# Patient Record
Sex: Male | Born: 1950 | Race: White | Hispanic: No | Marital: Married | Smoking: Former smoker
Health system: Southern US, Community
[De-identification: ages and names within clinical notes are randomized; demographics above are authoritative.]

## PROBLEM LIST (undated history)

## (undated) DIAGNOSIS — I10 Essential (primary) hypertension: Secondary | ICD-10-CM

## (undated) DIAGNOSIS — Z87442 Personal history of urinary calculi: Secondary | ICD-10-CM

## (undated) DIAGNOSIS — K219 Gastro-esophageal reflux disease without esophagitis: Secondary | ICD-10-CM

## (undated) DIAGNOSIS — H9190 Unspecified hearing loss, unspecified ear: Secondary | ICD-10-CM

## (undated) DIAGNOSIS — R112 Nausea with vomiting, unspecified: Secondary | ICD-10-CM

## (undated) DIAGNOSIS — Z9889 Other specified postprocedural states: Secondary | ICD-10-CM

## (undated) DIAGNOSIS — Z889 Allergy status to unspecified drugs, medicaments and biological substances status: Secondary | ICD-10-CM

## (undated) DIAGNOSIS — M5416 Radiculopathy, lumbar region: Secondary | ICD-10-CM

## (undated) HISTORY — PX: BACK SURGERY: SHX140

---

## 1998-08-03 ENCOUNTER — Ambulatory Visit (HOSPITAL_COMMUNITY): Admission: RE | Admit: 1998-08-03 | Discharge: 1998-08-03 | Payer: Self-pay | Admitting: Gastroenterology

## 2000-09-12 ENCOUNTER — Emergency Department (HOSPITAL_COMMUNITY): Admission: EM | Admit: 2000-09-12 | Discharge: 2000-09-13 | Payer: Self-pay | Admitting: Emergency Medicine

## 2000-09-12 ENCOUNTER — Encounter: Payer: Self-pay | Admitting: Emergency Medicine

## 2001-05-15 ENCOUNTER — Encounter: Payer: Self-pay | Admitting: Orthopedic Surgery

## 2001-05-15 ENCOUNTER — Ambulatory Visit (HOSPITAL_COMMUNITY): Admission: RE | Admit: 2001-05-15 | Discharge: 2001-05-15 | Payer: Self-pay | Admitting: Orthopedic Surgery

## 2004-10-12 ENCOUNTER — Ambulatory Visit: Payer: Self-pay | Admitting: Internal Medicine

## 2004-10-19 ENCOUNTER — Ambulatory Visit: Payer: Self-pay | Admitting: Internal Medicine

## 2005-02-11 ENCOUNTER — Ambulatory Visit: Payer: Self-pay | Admitting: Internal Medicine

## 2005-04-30 ENCOUNTER — Encounter: Admission: RE | Admit: 2005-04-30 | Discharge: 2005-04-30 | Payer: Self-pay | Admitting: Specialist

## 2006-05-16 ENCOUNTER — Ambulatory Visit: Payer: Self-pay | Admitting: Internal Medicine

## 2006-05-16 LAB — CONVERTED CEMR LAB
Albumin: 4.2 g/dL (ref 3.5–5.2)
Basophils Absolute: 0 10*3/uL (ref 0.0–0.1)
Basophils Relative: 0.4 % (ref 0.0–1.0)
Bilirubin, Direct: 0.1 mg/dL (ref 0.0–0.3)
Calcium: 9.3 mg/dL (ref 8.4–10.5)
GFR calc Af Amer: 81 mL/min
GFR calc non Af Amer: 67 mL/min
Glucose, Bld: 115 mg/dL — ABNORMAL HIGH (ref 70–99)
HCT: 45.7 % (ref 39.0–52.0)
HDL: 44.5 mg/dL (ref >39.0)
Hemoglobin: 15.6 g/dL (ref 13.0–17.0)
Hgb A1c MFr Bld: 5.7 % (ref 4.6–6.0)
LDL Cholesterol: 110 mg/dL — ABNORMAL HIGH (ref 0–99)
Monocytes Absolute: 0.6 10*3/uL (ref 0.2–0.7)
Monocytes Relative: 7.6 % (ref 3.0–11.0)
PSA: 0.69 ng/mL (ref 0.10–4.00)
RDW: 12.2 % (ref 11.5–14.6)
Sodium: 142 meq/L (ref 135–145)
TSH: 1.45 microintl units/mL (ref 0.35–5.50)
VLDL: 22 mg/dL (ref 0–40)

## 2006-05-23 ENCOUNTER — Ambulatory Visit: Payer: Self-pay | Admitting: Internal Medicine

## 2008-01-05 ENCOUNTER — Telehealth: Payer: Self-pay | Admitting: Internal Medicine

## 2008-01-18 ENCOUNTER — Ambulatory Visit: Payer: Self-pay | Admitting: Internal Medicine

## 2008-01-18 LAB — CONVERTED CEMR LAB
ALT: 25 units/L (ref 0–53)
Basophils Absolute: 0 10*3/uL (ref 0.0–0.1)
Basophils Relative: 0.4 % (ref 0.0–3.0)
CO2: 29 meq/L (ref 19–32)
Calcium: 8.9 mg/dL (ref 8.4–10.5)
Cholesterol: 179 mg/dL (ref 0–200)
Creatinine, Ser: 1.1 mg/dL (ref 0.4–1.5)
Eosinophils Absolute: 0.1 10*3/uL (ref 0.0–0.7)
GFR calc Af Amer: 89 mL/min
HCT: 45 % (ref 39.0–52.0)
Hemoglobin: 15.6 g/dL (ref 13.0–17.0)
Lymphocytes Relative: 36.8 % (ref 12.0–46.0)
MCHC: 34.7 g/dL (ref 30.0–36.0)
MCV: 96.8 fL (ref 78.0–100.0)
Monocytes Absolute: 0.6 10*3/uL (ref 0.1–1.0)
Neutro Abs: 3.2 10*3/uL (ref 1.4–7.7)
PSA: 0.68 ng/mL (ref 0.10–4.00)
RBC: 4.65 M/uL (ref 4.22–5.81)
TSH: 1.17 microintl units/mL (ref 0.35–5.50)
Total Bilirubin: 0.8 mg/dL (ref 0.3–1.2)
Triglycerides: 84 mg/dL (ref 0–149)

## 2008-01-27 ENCOUNTER — Ambulatory Visit: Payer: Self-pay | Admitting: Internal Medicine

## 2008-01-27 DIAGNOSIS — J309 Allergic rhinitis, unspecified: Secondary | ICD-10-CM | POA: Insufficient documentation

## 2008-01-27 DIAGNOSIS — E785 Hyperlipidemia, unspecified: Secondary | ICD-10-CM

## 2008-09-22 ENCOUNTER — Encounter (INDEPENDENT_AMBULATORY_CARE_PROVIDER_SITE_OTHER): Payer: Self-pay | Admitting: Specialist

## 2008-09-22 ENCOUNTER — Ambulatory Visit (HOSPITAL_COMMUNITY): Admission: RE | Admit: 2008-09-22 | Discharge: 2008-09-23 | Payer: Self-pay | Admitting: Specialist

## 2009-02-06 ENCOUNTER — Encounter (INDEPENDENT_AMBULATORY_CARE_PROVIDER_SITE_OTHER): Payer: Self-pay | Admitting: *Deleted

## 2009-03-03 ENCOUNTER — Encounter (INDEPENDENT_AMBULATORY_CARE_PROVIDER_SITE_OTHER): Payer: Self-pay | Admitting: *Deleted

## 2009-03-06 ENCOUNTER — Ambulatory Visit: Payer: Self-pay | Admitting: Gastroenterology

## 2009-03-06 ENCOUNTER — Telehealth (INDEPENDENT_AMBULATORY_CARE_PROVIDER_SITE_OTHER): Payer: Self-pay | Admitting: *Deleted

## 2009-03-22 ENCOUNTER — Ambulatory Visit: Payer: Self-pay | Admitting: Gastroenterology

## 2010-07-02 LAB — CBC
MCHC: 34.8 g/dL (ref 30.0–36.0)
RBC: 4.74 MIL/uL (ref 4.22–5.81)
RDW: 12.9 % (ref 11.5–15.5)

## 2010-07-02 LAB — COMPREHENSIVE METABOLIC PANEL
ALT: 25 U/L (ref 0–53)
AST: 21 U/L (ref 0–37)
Albumin: 4.1 g/dL (ref 3.5–5.2)
Calcium: 9.3 mg/dL (ref 8.4–10.5)
GFR calc Af Amer: >60 mL/min (ref >60)
Sodium: 143 mEq/L (ref 135–145)
Total Protein: 6.1 g/dL (ref 6.0–8.3)

## 2010-07-02 LAB — URINALYSIS, ROUTINE W REFLEX MICROSCOPIC
Bilirubin Urine: NEGATIVE
Nitrite: NEGATIVE
Specific Gravity, Urine: 1.03 (ref 1.005–1.030)
Urobilinogen, UA: 0.2 mg/dL (ref 0.0–1.0)

## 2010-08-07 NOTE — Op Note (Signed)
NAMECAID, RADIN             ACCOUNT NO.:  [id number]   MEDICAL RECORD NO.:  [id number]          PATIENT TYPE:  AMB   LOCATION:  DAY                          FACILITY:  [facility]   PHYSICIAN:  Jene Every, M.D.    DATE OF BIRTH:  Mar 16, 1951   DATE OF PROCEDURE:  09/22/2008  DATE OF DISCHARGE:                               OPERATIVE REPORT   PREOPERATIVE DIAGNOSES:  Spinal stenosis, herniated nucleus pulposus, L3-  4, left.   POSTOPERATIVE DIAGNOSES:  Spinal stenosis, herniated nucleus pulposus,  L3-4, left.   PROCEDURE PERFORMED:  1. Decompression, L3-4, left.  2. Microdiskectomy at L3-4, left.  3. Retrieval of free fragment.  4. Use of operating microscope.   BRIEF HISTORY:  The is a 60 year old with left lower extremity  radiculopathy, L4 nerve root distribution, quad weakness, diminished  reflex, decreased sensation in L4 dermatome, positive neural tension  sign and MRI that indicated an extruded fragment compressing the 4 root.  He was indicated for decompression of the 4 root by microdiskectomy.  The risks and benefits were discussed including bleeding, infection,  damage to neurovascular structures, CSF leakage, epidural fibrosis,  adjacent segment disease, need for fusion in the future, anesthetic  complications, etc.  He was also instructed to the commit to tobacco  cessation perioperatively to decrease the risk of epidural fibrosis,  infection and disk degenerating.   TECHNIQUE:  With the patient in the supine position after adequate  general anesthesia and 2 grams of Kefzol, the patient was placed prone  on the [place] frame.  All bony prominences were well-padded.  The  lumbar region was prepped and draped in the usual sterile fashion.  Two  18 gauge spinal needles were utilized to localize the 3-4 interspace and  confirmed with x-ray.  Incision was made from spinous process of 3-4.  The subcutaneous tissue was dissected with electrocautery to achieve  hemostasis.  Dorsolumbar fascia identified and divided in line with the  skin incision.  The paraspinous muscle elevated from lamina the lamina  of 3 and 4.  McCollough retractors placed.  Penfield 4 in the  interlaminar space, confirmed by x-ray.  Operating microscope draped and  brought on the surgical field.  We felt that with the extruded fragment  migrating caudad and it would be just under the interlaminar window of 3-  4 and therefore, we would not threaten the pars with a unilateral  decompression.  Ligamentum flavum detached from the cephalad edge of 4  utilizing a straight curette.  Then had placed a Penfield 4 beneath the  lamina and placed a neural patty beneath the lamina and then performed a  foraminotomy of 4 and decompressed it.  .  We gently mobilized the 4  root medially and saw a focal HNP extending out into the foramen and  cephalad to the disk space.  We decompressed the lateral recess and the  medial border of the pedicle protecting neural elements at all times.  The fragments extended up to the disk space and down to and into the  foramen of 4.  I started cephalad and then gently  mobilized a fragment  from beneath the thecal sac.  It was adherent to the thecal sac, as was  a fragment adherent to the 4 root.  This was meticulously mobilized with  a combination of a Penfield 4, a nerve hook and micro pituitary.  Gently  freeing the adhesed fragment from the nerve root and the thecal sac.  The disk herniation actually was retrieved in three separate fragments,  one beneath the thecal sac and again the other adherent to the 4 root  and out into the foramen of 4.  Following retrieval of these fragments  we evaluated cephalad to the disk space.  There was no residual disk  herniation.  There was osteophytic ridge.  I placed a hockey stick probe  out the foramen of 3 and 4 and they are widely patent.  We had at least  a centimeter excursion of the 4 root and medial pedicle  without  difficulty.  The nerve root was erythematous and edematous, but intact.  There was a vascular release.  Electrocautery to achieve hemostasis as  was thrombin-soaked Gelfoam.  We checked beneath the thecal sac, out the  foramen of 4 and 3, the shoulder of the root, the axilla of the root and  there was no residual disk herniation amenable to further disk  herniation and a freely mobile 4 root.  After copious irrigation with  antibiotic irrigation, thrombin-soaked Gelfoam we inspected and no CSF  leakage or active bleeding.  Therefore, we removed the [facility]  retractors.  Paraspinous muscle inspected with no active bleeding.  They  were  irrigated and closed and then closed the fascia with zero Vicryl  interrupted figure-of-eight sutures, subcutaneous with 2-0 Vicryl and  the  skin was reapproximated with 4-0 subcuticular Prolene.  Wound  reinforced with Steri-Strips.  Sterile dressing applied.  The patient  was placed supine on the hospital bed, extubated without difficulty and  transported to the recovery room in satisfactory addition.   The patient tolerated the procedure.  There were no complications.  Minimal blood loss.      Jene Every, M.D.  Electronically Signed     JB/MEDQ  D:  09/22/2008  T:  09/22/2008  Job:  [id number]

## 2010-10-15 ENCOUNTER — Other Ambulatory Visit: Payer: Self-pay | Admitting: Neurosurgery

## 2010-10-15 DIAGNOSIS — M545 Low back pain: Secondary | ICD-10-CM

## 2010-10-18 ENCOUNTER — Ambulatory Visit
Admission: RE | Admit: 2010-10-18 | Discharge: 2010-10-18 | Disposition: A | Discharge status: Home / Self Care | Payer: BC Managed Care – PPO | Source: Ambulatory Visit | Attending: Neurosurgery | Admitting: Neurosurgery

## 2010-10-18 DIAGNOSIS — M545 Low back pain: Secondary | ICD-10-CM

## 2010-10-18 MED ORDER — GADOBENATE DIMEGLUMINE 529 MG/ML IV SOLN
20.0000 mL | Freq: Once | INTRAVENOUS | Status: AC | PRN
  Administered 2010-10-18: 20 mL via INTRAVENOUS

## 2014-10-25 ENCOUNTER — Encounter: Payer: Self-pay | Admitting: Gastroenterology

## 2015-10-13 ENCOUNTER — Other Ambulatory Visit: Payer: Self-pay | Admitting: Neurosurgery

## 2015-10-13 DIAGNOSIS — M47817 Spondylosis without myelopathy or radiculopathy, lumbosacral region: Secondary | ICD-10-CM

## 2015-10-19 ENCOUNTER — Ambulatory Visit
Admission: RE | Admit: 2015-10-19 | Discharge: 2015-10-19 | Disposition: A | Discharge status: Home / Self Care | Payer: PRIVATE HEALTH INSURANCE | Source: Ambulatory Visit | Attending: Neurosurgery | Admitting: Neurosurgery

## 2015-10-19 DIAGNOSIS — M47817 Spondylosis without myelopathy or radiculopathy, lumbosacral region: Secondary | ICD-10-CM

## 2018-02-13 ENCOUNTER — Other Ambulatory Visit: Payer: Self-pay | Admitting: Neurosurgery

## 2018-02-13 DIAGNOSIS — M4716 Other spondylosis with myelopathy, lumbar region: Secondary | ICD-10-CM

## 2018-02-13 DIAGNOSIS — M5126 Other intervertebral disc displacement, lumbar region: Secondary | ICD-10-CM

## 2018-02-14 ENCOUNTER — Ambulatory Visit
Admission: RE | Admit: 2018-02-14 | Discharge: 2018-02-14 | Disposition: A | Discharge status: Home / Self Care | Payer: Medicare HMO | Source: Ambulatory Visit | Attending: Neurosurgery | Admitting: Neurosurgery

## 2018-02-14 DIAGNOSIS — M5126 Other intervertebral disc displacement, lumbar region: Secondary | ICD-10-CM

## 2018-02-14 DIAGNOSIS — M4716 Other spondylosis with myelopathy, lumbar region: Secondary | ICD-10-CM

## 2018-02-16 ENCOUNTER — Other Ambulatory Visit: Payer: Self-pay | Admitting: Nurse Practitioner

## 2018-02-16 DIAGNOSIS — M4716 Other spondylosis with myelopathy, lumbar region: Secondary | ICD-10-CM

## 2018-02-18 ENCOUNTER — Ambulatory Visit
Admission: RE | Admit: 2018-02-18 | Discharge: 2018-02-18 | Disposition: A | Discharge status: Home / Self Care | Payer: Medicare HMO | Source: Ambulatory Visit | Attending: Nurse Practitioner | Admitting: Nurse Practitioner

## 2018-02-18 DIAGNOSIS — M4716 Other spondylosis with myelopathy, lumbar region: Secondary | ICD-10-CM

## 2018-02-18 MED ORDER — IOPAMIDOL (ISOVUE-M 200) INJECTION 41%
1.0000 mL | Freq: Once | INTRAMUSCULAR | Status: AC
  Administered 2018-02-18: 1 mL via EPIDURAL

## 2018-02-18 MED ORDER — METHYLPREDNISOLONE ACETATE 40 MG/ML INJ SUSP (RADIOLOG
120.0000 mg | Freq: Once | INTRAMUSCULAR | Status: AC
  Administered 2018-02-18: 120 mg via EPIDURAL

## 2018-02-26 ENCOUNTER — Other Ambulatory Visit: Payer: PRIVATE HEALTH INSURANCE

## 2018-02-26 ENCOUNTER — Other Ambulatory Visit: Payer: Self-pay | Admitting: Neurosurgery

## 2018-02-26 DIAGNOSIS — M4716 Other spondylosis with myelopathy, lumbar region: Secondary | ICD-10-CM

## 2018-03-05 ENCOUNTER — Other Ambulatory Visit: Payer: Self-pay | Admitting: Neurosurgery

## 2018-03-05 ENCOUNTER — Ambulatory Visit
Admission: RE | Admit: 2018-03-05 | Discharge: 2018-03-05 | Disposition: A | Discharge status: Home / Self Care | Payer: Medicare HMO | Source: Ambulatory Visit | Attending: Neurosurgery | Admitting: Neurosurgery

## 2018-03-05 DIAGNOSIS — M4716 Other spondylosis with myelopathy, lumbar region: Secondary | ICD-10-CM

## 2018-03-05 MED ORDER — IOPAMIDOL (ISOVUE-M 200) INJECTION 41%
1.0000 mL | Freq: Once | INTRAMUSCULAR | Status: AC
  Administered 2018-03-05: 1 mL via EPIDURAL

## 2018-03-05 MED ORDER — METHYLPREDNISOLONE ACETATE 40 MG/ML INJ SUSP (RADIOLOG
120.0000 mg | Freq: Once | INTRAMUSCULAR | Status: AC
  Administered 2018-03-05: 120 mg via EPIDURAL

## 2018-04-08 ENCOUNTER — Other Ambulatory Visit: Payer: Self-pay | Admitting: Neurosurgery

## 2018-04-08 DIAGNOSIS — M5126 Other intervertebral disc displacement, lumbar region: Secondary | ICD-10-CM

## 2018-04-13 ENCOUNTER — Ambulatory Visit
Admission: RE | Admit: 2018-04-13 | Discharge: 2018-04-13 | Disposition: A | Discharge status: Home / Self Care | Payer: Medicare HMO | Source: Ambulatory Visit | Attending: Neurosurgery | Admitting: Neurosurgery

## 2018-04-13 DIAGNOSIS — M5126 Other intervertebral disc displacement, lumbar region: Secondary | ICD-10-CM

## 2018-04-13 MED ORDER — IOPAMIDOL (ISOVUE-M 200) INJECTION 41%
1.0000 mL | Freq: Once | INTRAMUSCULAR | Status: AC
  Administered 2018-04-13: 1 mL via EPIDURAL

## 2018-04-13 MED ORDER — METHYLPREDNISOLONE ACETATE 40 MG/ML INJ SUSP (RADIOLOG
120.0000 mg | Freq: Once | INTRAMUSCULAR | Status: AC
  Administered 2018-04-13: 120 mg via EPIDURAL

## 2018-06-02 ENCOUNTER — Other Ambulatory Visit: Payer: Self-pay | Admitting: Neurological Surgery

## 2018-06-18 NOTE — Pre-Procedure Instructions (Signed)
Anders Simmonds Jr.  06/18/2018      [id number] - [place], [place] - [address] [place] [address] Phone: [phone] Fax: [phone]    Your procedure is scheduled on Monday, June 29, 2018.  Report to [facility] Admitting - Entrance A at 10:45 am.  Call this number if you have problems the morning of surgery:  [phone]   Remember:  Do not eat or drink after midnight.    Take these medicines the morning of surgery with A SIP OF WATER:  None  7 days prior to surgery STOP taking any Aspirin (unless otherwise instructed by your surgeon), Aleve, Naproxen, Ibuprofen, Motrin, Advil, Goody's, BC's, all herbal medications, fish oil, and all vitamins.    Do not wear jewelry.  Do not wear lotions, powders, or cologne/aftershave, or deodorant.  Men may shave face and neck.  Do not bring valuables to the hospital.  [facility] is not responsible for any belongings or valuables.  Contacts, dentures or bridgework may not be worn into surgery.  Leave your suitcase in the car.  After surgery it may be brought to your room.  For patients admitted to the hospital, discharge time will be determined by your treatment team.  Patients discharged the day of surgery will not be allowed to drive home.    [place]- Preparing For Surgery  Before surgery, you can play an important role. Because skin is not sterile, your skin needs to be as free of germs as possible. You can reduce the number of germs on your skin by washing with CHG (chlorahexidine gluconate) Soap before surgery.  CHG is an antiseptic cleaner which kills germs and bonds with the skin to continue killing germs even after washing.    Oral Hygiene is also important to reduce your risk of infection.  Remember - BRUSH YOUR TEETH THE MORNING OF SURGERY WITH YOUR REGULAR TOOTHPASTE  Please do not use if you have an allergy to CHG or antibacterial  soaps. If your skin becomes reddened/irritated stop using the CHG.  Do not shave (including legs and underarms) for at least 48 hours prior to first CHG shower. It is OK to shave your face.  Please follow these instructions carefully.   1. Shower the NIGHT BEFORE SURGERY and the MORNING OF SURGERY with CHG.   2. If you chose to wash your hair, wash your hair first as usual with your normal shampoo.  3. After you shampoo, rinse your hair and body thoroughly to remove the shampoo.  4. Use CHG as you would any other liquid soap. You can apply CHG directly to the skin and wash gently with a scrungie or a clean washcloth.   5. Apply the CHG Soap to your body ONLY FROM THE NECK DOWN.  Do not use on open wounds or open sores. Avoid contact with your eyes, ears, mouth and genitals (private parts). Wash Face and genitals (private parts)  with your normal soap.  6. Wash thoroughly, paying special attention to the area where your surgery will be performed.  7. Thoroughly rinse your body with warm water from the neck down.  8. DO NOT shower/wash with your normal soap after using and rinsing off the CHG Soap.  9. Pat yourself dry with a CLEAN TOWEL.  10. Wear CLEAN PAJAMAS to bed the night before surgery, wear comfortable clothes the morning of surgery  11. Place CLEAN  SHEETS on your bed the night of your first shower and DO NOT SLEEP WITH PETS.   Day of Surgery: Do not apply any deodorants/lotions.  Please wear clean clothes to the hospital/surgery center.     Patient was informed of Hospital's Visitation Restriction Policy that is now in effect.

## 2018-06-19 ENCOUNTER — Encounter (HOSPITAL_COMMUNITY): Payer: Self-pay

## 2018-06-19 ENCOUNTER — Encounter (HOSPITAL_COMMUNITY)
Admission: RE | Admit: 2018-06-19 | Discharge: 2018-06-19 | Disposition: A | Discharge status: Home / Self Care | Payer: Medicare HMO | Source: Ambulatory Visit | Attending: Neurological Surgery | Admitting: Neurological Surgery

## 2018-06-19 ENCOUNTER — Other Ambulatory Visit: Payer: Self-pay

## 2018-06-19 DIAGNOSIS — Z01818 Encounter for other preprocedural examination: Secondary | ICD-10-CM | POA: Diagnosis present

## 2018-06-19 HISTORY — DX: Other specified postprocedural states: Z98.890

## 2018-06-19 HISTORY — DX: Personal history of urinary calculi: Z87.442

## 2018-06-19 HISTORY — DX: Unspecified hearing loss, unspecified ear: H91.90

## 2018-06-19 HISTORY — DX: Allergy status to unspecified drugs, medicaments and biological substances: Z88.9

## 2018-06-19 HISTORY — DX: Nausea with vomiting, unspecified: R11.2

## 2018-06-19 HISTORY — DX: Gastro-esophageal reflux disease without esophagitis: K21.9

## 2018-06-19 HISTORY — DX: Essential (primary) hypertension: I10

## 2018-06-19 LAB — BASIC METABOLIC PANEL
Anion gap: 8 (ref 5–15)
BUN: 16 mg/dL (ref 8–23)
CO2: 24 mmol/L (ref 22–32)
Calcium: 9.3 mg/dL (ref 8.9–10.3)
Chloride: 105 mmol/L (ref 98–111)
Creatinine, Ser: 1 mg/dL (ref 0.61–1.24)
GFR calc Af Amer: >60 mL/min (ref >60)
GFR calc non Af Amer: >60 mL/min (ref >60)
Glucose, Bld: 136 mg/dL — ABNORMAL HIGH (ref 70–99)
Potassium: 4.6 mmol/L (ref 3.5–5.1)
Sodium: 137 mmol/L (ref 135–145)

## 2018-06-19 LAB — CBC
HCT: 43.8 % (ref 39.0–52.0)
Hemoglobin: 14.5 g/dL (ref 13.0–17.0)
MCH: 31.4 pg (ref 26.0–34.0)
MCHC: 33.1 g/dL (ref 30.0–36.0)
MCV: 94.8 fL (ref 80.0–100.0)
PLATELETS: 237 10*3/uL (ref 150–400)
RBC: 4.62 MIL/uL (ref 4.22–5.81)
RDW: 12 % (ref 11.5–15.5)
WBC: 5.2 10*3/uL (ref 4.0–10.5)
nRBC: 0 % (ref 0.0–0.2)

## 2018-06-19 LAB — TYPE AND SCREEN
ABO/RH(D): A POS
Antibody Screen: NEGATIVE

## 2018-06-19 LAB — ABO/RH: ABO/RH(D): A POS

## 2018-06-19 LAB — SURGICAL PCR SCREEN
MRSA, PCR: NEGATIVE
Staphylococcus aureus: POSITIVE — AB

## 2018-06-19 NOTE — Pre-Procedure Instructions (Signed)
Anders Simmonds Jr.  06/19/2018      [id number] - [place], [place] - [address] [place] [address] Phone: [phone] Fax: [phone]    Your procedure is scheduled on Monday, June 29, 2018.   Report to [facility] Admitting - Entrance A at 10:45 am.             (posted surgery time 12:49p -  4:4a)   Call this number if you have problems the morning of surgery:  [phone]   Remember:  Do not eat any foods or drink any liquids after midnight, Sunday.    Take these medicines the morning of surgery with A SIP OF WATER:  None  7 days prior to surgery STOP taking any Aspirin (unless otherwise instructed by your surgeon), Aleve, Naproxen, Ibuprofen, Motrin, Advil, Goody's, BC's, all herbal medications, fish oil, and all vitamins.    Do not wear jewelry.  Do not wear lotions, powders, or cologne/aftershave, or deodorant.  Men may shave face and neck.  Do not bring valuables to the hospital.  [facility] is not responsible for any belongings or valuables.  Contacts, dentures or bridgework may not be worn into surgery.  Leave your suitcase in the car.  After surgery it may be brought to your room.  For patients admitted to the hospital, discharge time will be determined by your treatment team.  Patients discharged the day of surgery will not be allowed to drive home.    [place]- Preparing For Surgery  Before surgery, you can play an important role. Because skin is not sterile, your skin needs to be as free of germs as possible. You can reduce the number of germs on your skin by washing with CHG (chlorahexidine gluconate) Soap before surgery.  CHG is an antiseptic cleaner which kills germs and bonds with the skin to continue killing germs even after washing.    Oral Hygiene is also important to reduce your risk of infection.  Remember - BRUSH YOUR TEETH THE MORNING OF SURGERY WITH YOUR  REGULAR TOOTHPASTE  Please do not use if you have an allergy to CHG or antibacterial soaps. If your skin becomes reddened/irritated stop using the CHG.  Do not shave (including legs and underarms) for at least 48 hours prior to first CHG shower. It is OK to shave your face.  Please follow these instructions carefully.   1. Shower the NIGHT BEFORE SURGERY and the MORNING OF SURGERY with CHG.   2. If you chose to wash your hair, wash your hair first as usual with your normal shampoo.  3. After you shampoo, rinse your hair and body thoroughly to remove the shampoo.  4. Use CHG as you would any other liquid soap. You can apply CHG directly to the skin and wash gently with a scrungie or a clean washcloth.   5. Apply the CHG Soap to your body ONLY FROM THE NECK DOWN.  Do not use on open wounds or open sores. Avoid contact with your eyes, ears, mouth and genitals (private parts). Wash Face and genitals (private parts)  with your normal soap.  6. Wash thoroughly, paying special attention to the area where your surgery will be performed.  7. Thoroughly rinse your body with warm water from the neck down.  8. DO NOT shower/wash with your normal soap after using and rinsing off the CHG Soap.  9. Pat yourself dry  with a CLEAN TOWEL.  10. Wear CLEAN PAJAMAS to bed the night before surgery, wear comfortable clothes the morning of surgery  11. Place CLEAN SHEETS on your bed the night of your first shower and DO NOT SLEEP WITH PETS.   Day of Surgery: Do not apply any deodorants/lotions.  Please wear clean clothes to the hospital/surgery center.     Patient was informed of Hospital's Visitation Restriction Policy that is now in effect.

## 2018-06-19 NOTE — Progress Notes (Signed)
PCP - Merlyn Lot  LOV 02/2018  Cardiologist -  denies  Chest x-ray - n/a EKG - 06/19/2018 Stress Test - denies ECHO - denies Cardiac Cath -   Sleep Study - denies CPAP -   Fasting Blood Sugar - denies Checks Blood Sugar _____ times a day  Blood Thinner Instructions:  n/a Aspirin Instructions:  Anesthesia review:  no  Patient denies shortness of breath, fever, cough and chest pain at PAT appointment   Patient verbalized understanding of instructions that were given to them at the PAT appointment. Patient was also instructed that they will need to review over the PAT instructions again at home before surgery.

## 2018-06-26 MED ORDER — DEXTROSE 5 % IV SOLN
3.0000 g | INTRAVENOUS | Status: AC
  Administered 2018-06-29: 12:00:00 3 g via INTRAVENOUS
  Filled 2018-06-26: qty 3

## 2018-06-29 ENCOUNTER — Encounter (HOSPITAL_COMMUNITY)
Admission: RE | Disposition: A | Discharge status: Home / Self Care | Payer: Self-pay | Source: Home / Self Care | Attending: Neurological Surgery

## 2018-06-29 ENCOUNTER — Inpatient Hospital Stay (HOSPITAL_COMMUNITY): Payer: Medicare HMO

## 2018-06-29 ENCOUNTER — Encounter (HOSPITAL_COMMUNITY): Payer: Self-pay

## 2018-06-29 ENCOUNTER — Inpatient Hospital Stay (HOSPITAL_COMMUNITY): Payer: Medicare HMO | Admitting: Anesthesiology

## 2018-06-29 ENCOUNTER — Other Ambulatory Visit: Payer: Self-pay

## 2018-06-29 ENCOUNTER — Inpatient Hospital Stay (HOSPITAL_COMMUNITY)
Admission: RE | Admit: 2018-06-29 | Discharge: 2018-06-30 | DRG: 517 | Disposition: A | Discharge status: Home / Self Care | Payer: Medicare HMO | Attending: Neurological Surgery | Admitting: Neurological Surgery

## 2018-06-29 ENCOUNTER — Inpatient Hospital Stay (HOSPITAL_COMMUNITY): Payer: Medicare HMO | Admitting: Vascular Surgery

## 2018-06-29 DIAGNOSIS — Z87891 Personal history of nicotine dependence: Secondary | ICD-10-CM | POA: Diagnosis not present

## 2018-06-29 DIAGNOSIS — M4726 Other spondylosis with radiculopathy, lumbar region: Secondary | ICD-10-CM | POA: Diagnosis present

## 2018-06-29 DIAGNOSIS — M549 Dorsalgia, unspecified: Secondary | ICD-10-CM | POA: Diagnosis present

## 2018-06-29 DIAGNOSIS — M6740 Ganglion, unspecified site: Secondary | ICD-10-CM | POA: Diagnosis present

## 2018-06-29 DIAGNOSIS — Z981 Arthrodesis status: Secondary | ICD-10-CM | POA: Diagnosis not present

## 2018-06-29 DIAGNOSIS — H9190 Unspecified hearing loss, unspecified ear: Secondary | ICD-10-CM | POA: Diagnosis present

## 2018-06-29 DIAGNOSIS — M48062 Spinal stenosis, lumbar region with neurogenic claudication: Secondary | ICD-10-CM | POA: Diagnosis present

## 2018-06-29 DIAGNOSIS — K219 Gastro-esophageal reflux disease without esophagitis: Secondary | ICD-10-CM | POA: Diagnosis present

## 2018-06-29 DIAGNOSIS — Z419 Encounter for procedure for purposes other than remedying health state, unspecified: Secondary | ICD-10-CM

## 2018-06-29 DIAGNOSIS — I10 Essential (primary) hypertension: Secondary | ICD-10-CM | POA: Diagnosis present

## 2018-06-29 DIAGNOSIS — Z87442 Personal history of urinary calculi: Secondary | ICD-10-CM | POA: Diagnosis not present

## 2018-06-29 HISTORY — PX: LUMBAR LAMINECTOMY/DECOMPRESSION MICRODISCECTOMY: SHX5026

## 2018-06-29 HISTORY — DX: Radiculopathy, lumbar region: M54.16

## 2018-06-29 SURGERY — LUMBAR LAMINECTOMY/DECOMPRESSION MICRODISCECTOMY 1 LEVEL
Anesthesia: General | Site: Spine Lumbar | Laterality: Bilateral

## 2018-06-29 MED ORDER — METHYLPREDNISOLONE ACETATE 80 MG/ML IJ SUSP
INTRAMUSCULAR | Status: AC
  Filled 2018-06-29: qty 1

## 2018-06-29 MED ORDER — PROPOFOL 10 MG/ML IV BOLUS
INTRAVENOUS | Status: AC
  Filled 2018-06-29: qty 20

## 2018-06-29 MED ORDER — LIDOCAINE-EPINEPHRINE 1 %-1:100000 IJ SOLN
INTRAMUSCULAR | Status: DC | PRN
  Administered 2018-06-29: 5 mL

## 2018-06-29 MED ORDER — THROMBIN 20000 UNITS EX SOLR
CUTANEOUS | Status: DC | PRN
  Administered 2018-06-29: 20 mL via TOPICAL

## 2018-06-29 MED ORDER — PHENYLEPHRINE 40 MCG/ML (10ML) SYRINGE FOR IV PUSH (FOR BLOOD PRESSURE SUPPORT)
PREFILLED_SYRINGE | INTRAVENOUS | Status: DC | PRN
  Administered 2018-06-29 (×2): 80 ug via INTRAVENOUS

## 2018-06-29 MED ORDER — METHYLPREDNISOLONE ACETATE 80 MG/ML IJ SUSP
INTRAMUSCULAR | Status: DC | PRN
  Administered 2018-06-29: 80 mg

## 2018-06-29 MED ORDER — MORPHINE SULFATE (PF) 2 MG/ML IV SOLN: 2.0000 mg | INTRAVENOUS | Status: DC | PRN

## 2018-06-29 MED ORDER — OXYCODONE HCL 5 MG/5ML PO SOLN: 5.0000 mg | Freq: Once | ORAL | Status: DC | PRN

## 2018-06-29 MED ORDER — BUPIVACAINE HCL (PF) 0.5 % IJ SOLN
INTRAMUSCULAR | Status: AC
  Filled 2018-06-29: qty 30

## 2018-06-29 MED ORDER — EPHEDRINE SULFATE-NACL 50-0.9 MG/10ML-% IV SOSY
PREFILLED_SYRINGE | INTRAVENOUS | Status: DC | PRN
  Administered 2018-06-29 (×2): 5 mg via INTRAVENOUS

## 2018-06-29 MED ORDER — PROPOFOL 10 MG/ML IV BOLUS
INTRAVENOUS | Status: DC | PRN
  Administered 2018-06-29: 180 mg via INTRAVENOUS

## 2018-06-29 MED ORDER — FLEET ENEMA 7-19 GM/118ML RE ENEM: 1.0000 | ENEMA | Freq: Once | RECTAL | Status: DC | PRN

## 2018-06-29 MED ORDER — METHOCARBAMOL 1000 MG/10ML IJ SOLN
500.0000 mg | Freq: Four times a day (QID) | INTRAVENOUS | Status: DC | PRN
  Filled 2018-06-29: qty 5

## 2018-06-29 MED ORDER — CHLORHEXIDINE GLUCONATE CLOTH 2 % EX PADS
6.0000 | MEDICATED_PAD | Freq: Once | CUTANEOUS | Status: DC
  Administered 2018-06-29: 6 via TOPICAL

## 2018-06-29 MED ORDER — KETOROLAC TROMETHAMINE 15 MG/ML IJ SOLN
7.5000 mg | Freq: Four times a day (QID) | INTRAMUSCULAR | Status: DC
  Administered 2018-06-29 – 2018-06-30 (×3): 7.5 mg via INTRAVENOUS
  Filled 2018-06-29 (×3): qty 1

## 2018-06-29 MED ORDER — METHOCARBAMOL 500 MG PO TABS
500.0000 mg | ORAL_TABLET | Freq: Four times a day (QID) | ORAL | Status: DC | PRN
  Administered 2018-06-29: 500 mg via ORAL
  Filled 2018-06-29: qty 1

## 2018-06-29 MED ORDER — 0.9 % SODIUM CHLORIDE (POUR BTL) OPTIME
TOPICAL | Status: DC | PRN
  Administered 2018-06-29: 1000 mL

## 2018-06-29 MED ORDER — MIDAZOLAM HCL 2 MG/2ML IJ SOLN
INTRAMUSCULAR | Status: AC
  Filled 2018-06-29: qty 2

## 2018-06-29 MED ORDER — SODIUM CHLORIDE 0.9 % IV SOLN: 250.0000 mL | INTRAVENOUS | Status: DC

## 2018-06-29 MED ORDER — OXYCODONE HCL 5 MG PO TABS: 5.0000 mg | ORAL_TABLET | Freq: Once | ORAL | Status: DC | PRN

## 2018-06-29 MED ORDER — POLYETHYLENE GLYCOL 3350 17 G PO PACK: 17.0000 g | PACK | Freq: Every day | ORAL | Status: DC | PRN

## 2018-06-29 MED ORDER — PHENOL 1.4 % MT LIQD: 1.0000 | OROMUCOSAL | Status: DC | PRN

## 2018-06-29 MED ORDER — PHENYLEPHRINE 40 MCG/ML (10ML) SYRINGE FOR IV PUSH (FOR BLOOD PRESSURE SUPPORT)
PREFILLED_SYRINGE | INTRAVENOUS | Status: AC
  Filled 2018-06-29: qty 10

## 2018-06-29 MED ORDER — THROMBIN 20000 UNITS EX SOLR
CUTANEOUS | Status: AC
  Filled 2018-06-29: qty 20000

## 2018-06-29 MED ORDER — BUPIVACAINE HCL (PF) 0.5 % IJ SOLN
INTRAMUSCULAR | Status: DC | PRN
  Administered 2018-06-29: 5 mL
  Administered 2018-06-29: 35 mL

## 2018-06-29 MED ORDER — ACETAMINOPHEN 325 MG PO TABS
650.0000 mg | ORAL_TABLET | ORAL | Status: DC | PRN
  Administered 2018-06-29 – 2018-06-30 (×2): 650 mg via ORAL
  Filled 2018-06-29 (×2): qty 2

## 2018-06-29 MED ORDER — SUCCINYLCHOLINE CHLORIDE 200 MG/10ML IV SOSY
PREFILLED_SYRINGE | INTRAVENOUS | Status: AC
  Filled 2018-06-29: qty 10

## 2018-06-29 MED ORDER — FENTANYL CITRATE (PF) 100 MCG/2ML IJ SOLN: 25.0000 ug | INTRAMUSCULAR | Status: DC | PRN

## 2018-06-29 MED ORDER — SENNA 8.6 MG PO TABS
1.0000 | ORAL_TABLET | Freq: Two times a day (BID) | ORAL | Status: DC
  Administered 2018-06-29 – 2018-06-30 (×2): 8.6 mg via ORAL
  Filled 2018-06-29 (×2): qty 1

## 2018-06-29 MED ORDER — ONDANSETRON HCL 4 MG/2ML IJ SOLN
INTRAMUSCULAR | Status: DC | PRN
  Administered 2018-06-29: 4 mg via INTRAVENOUS

## 2018-06-29 MED ORDER — ALUM & MAG HYDROXIDE-SIMETH 200-200-20 MG/5ML PO SUSP: 30.0000 mL | Freq: Four times a day (QID) | ORAL | Status: DC | PRN

## 2018-06-29 MED ORDER — ONDANSETRON HCL 4 MG PO TABS: 4.0000 mg | ORAL_TABLET | Freq: Four times a day (QID) | ORAL | Status: DC | PRN

## 2018-06-29 MED ORDER — LIDOCAINE 2% (20 MG/ML) 5 ML SYRINGE
INTRAMUSCULAR | Status: AC
  Filled 2018-06-29: qty 5

## 2018-06-29 MED ORDER — LIDOCAINE 2% (20 MG/ML) 5 ML SYRINGE
INTRAMUSCULAR | Status: DC | PRN
  Administered 2018-06-29: 60 mg via INTRAVENOUS

## 2018-06-29 MED ORDER — LABETALOL HCL 5 MG/ML IV SOLN
INTRAVENOUS | Status: AC
  Administered 2018-06-29: 16:00:00
  Filled 2018-06-29: qty 4

## 2018-06-29 MED ORDER — LABETALOL HCL 5 MG/ML IV SOLN
10.0000 mg | INTRAVENOUS | Status: DC | PRN
  Administered 2018-06-29: 10 mg via INTRAVENOUS

## 2018-06-29 MED ORDER — SUGAMMADEX SODIUM 200 MG/2ML IV SOLN
INTRAVENOUS | Status: DC | PRN
  Administered 2018-06-29: 200 mg via INTRAVENOUS

## 2018-06-29 MED ORDER — LIDOCAINE-EPINEPHRINE 1 %-1:100000 IJ SOLN
INTRAMUSCULAR | Status: AC
  Filled 2018-06-29: qty 1

## 2018-06-29 MED ORDER — ACETAMINOPHEN 650 MG RE SUPP: 650.0000 mg | RECTAL | Status: DC | PRN

## 2018-06-29 MED ORDER — DOCUSATE SODIUM 100 MG PO CAPS
100.0000 mg | ORAL_CAPSULE | Freq: Two times a day (BID) | ORAL | Status: DC
  Administered 2018-06-29 – 2018-06-30 (×2): 100 mg via ORAL
  Filled 2018-06-29 (×2): qty 1

## 2018-06-29 MED ORDER — ONDANSETRON HCL 4 MG/2ML IJ SOLN
INTRAMUSCULAR | Status: AC
  Filled 2018-06-29: qty 2

## 2018-06-29 MED ORDER — SODIUM CHLORIDE 0.9 % IV SOLN
INTRAVENOUS | Status: DC | PRN
  Administered 2018-06-29: 60 ug/min via INTRAVENOUS

## 2018-06-29 MED ORDER — THROMBIN 5000 UNITS EX SOLR
CUTANEOUS | Status: AC
  Filled 2018-06-29: qty 5000

## 2018-06-29 MED ORDER — DEXAMETHASONE SODIUM PHOSPHATE 10 MG/ML IJ SOLN
INTRAMUSCULAR | Status: AC
  Filled 2018-06-29: qty 1

## 2018-06-29 MED ORDER — LACTATED RINGERS IV SOLN
INTRAVENOUS | Status: DC
  Administered 2018-06-29 (×2): via INTRAVENOUS

## 2018-06-29 MED ORDER — DEXAMETHASONE SODIUM PHOSPHATE 10 MG/ML IJ SOLN
INTRAMUSCULAR | Status: DC | PRN
  Administered 2018-06-29: 10 mg via INTRAVENOUS

## 2018-06-29 MED ORDER — MENTHOL 3 MG MT LOZG: 1.0000 | LOZENGE | OROMUCOSAL | Status: DC | PRN

## 2018-06-29 MED ORDER — ONDANSETRON HCL 4 MG/2ML IJ SOLN: 4.0000 mg | Freq: Four times a day (QID) | INTRAMUSCULAR | Status: DC | PRN

## 2018-06-29 MED ORDER — LOSARTAN POTASSIUM 50 MG PO TABS
50.0000 mg | ORAL_TABLET | Freq: Every day | ORAL | Status: DC
  Administered 2018-06-30: 50 mg via ORAL
  Filled 2018-06-29: qty 1

## 2018-06-29 MED ORDER — MIDAZOLAM HCL 5 MG/5ML IJ SOLN
INTRAMUSCULAR | Status: DC | PRN
  Administered 2018-06-29: 2 mg via INTRAVENOUS

## 2018-06-29 MED ORDER — ROCURONIUM BROMIDE 50 MG/5ML IV SOSY
PREFILLED_SYRINGE | INTRAVENOUS | Status: AC
  Filled 2018-06-29: qty 5

## 2018-06-29 MED ORDER — SUCCINYLCHOLINE CHLORIDE 200 MG/10ML IV SOSY
PREFILLED_SYRINGE | INTRAVENOUS | Status: DC | PRN
  Administered 2018-06-29: 140 mg via INTRAVENOUS

## 2018-06-29 MED ORDER — ROCURONIUM BROMIDE 10 MG/ML (PF) SYRINGE
PREFILLED_SYRINGE | INTRAVENOUS | Status: DC | PRN
  Administered 2018-06-29: 50 mg via INTRAVENOUS

## 2018-06-29 MED ORDER — SUFENTANIL CITRATE 50 MCG/ML IV SOLN
INTRAVENOUS | Status: AC
  Filled 2018-06-29: qty 1

## 2018-06-29 MED ORDER — SODIUM CHLORIDE 0.9% FLUSH
3.0000 mL | Freq: Two times a day (BID) | INTRAVENOUS | Status: DC
  Administered 2018-06-29 – 2018-06-30 (×2): 3 mL via INTRAVENOUS

## 2018-06-29 MED ORDER — OXYCODONE-ACETAMINOPHEN 5-325 MG PO TABS
1.0000 | ORAL_TABLET | ORAL | Status: DC | PRN
  Administered 2018-06-29: 1 via ORAL
  Filled 2018-06-29: qty 1

## 2018-06-29 MED ORDER — SODIUM CHLORIDE 0.9% FLUSH: 3.0000 mL | INTRAVENOUS | Status: DC | PRN

## 2018-06-29 MED ORDER — THROMBIN 5000 UNITS EX SOLR
OROMUCOSAL | Status: DC | PRN
  Administered 2018-06-29: 5 mL via TOPICAL

## 2018-06-29 MED ORDER — EPHEDRINE 5 MG/ML INJ
INTRAVENOUS | Status: AC
  Filled 2018-06-29: qty 10

## 2018-06-29 MED ORDER — SUFENTANIL CITRATE 50 MCG/ML IV SOLN
INTRAVENOUS | Status: DC | PRN
  Administered 2018-06-29: 20 ug via INTRAVENOUS
  Administered 2018-06-29: 10 ug via INTRAVENOUS

## 2018-06-29 MED ORDER — BISACODYL 10 MG RE SUPP: 10.0000 mg | Freq: Every day | RECTAL | Status: DC | PRN

## 2018-06-29 SURGICAL SUPPLY — 59 items
BAG DECANTER FOR FLEXI CONT (MISCELLANEOUS) IMPLANT
BASKET BONE COLLECTION (BASKET) IMPLANT
BLADE CLIPPER SURG (BLADE) ×4 IMPLANT
BUR MATCHSTICK NEURO 3.0 LAGG (BURR) ×4 IMPLANT
CANISTER SUCT 3000ML PPV (MISCELLANEOUS) ×4 IMPLANT
CONT SPEC 4OZ CLIKSEAL STRL BL (MISCELLANEOUS) ×4 IMPLANT
COVER BACK TABLE 60X90IN (DRAPES) ×4 IMPLANT
COVER WAND RF STERILE (DRAPES) IMPLANT
DECANTER SPIKE VIAL GLASS SM (MISCELLANEOUS) ×4 IMPLANT
DERMABOND ADVANCED (GAUZE/BANDAGES/DRESSINGS) ×2
DERMABOND ADVANCED .7 DNX12 (GAUZE/BANDAGES/DRESSINGS) ×2 IMPLANT
DEVICE DISSECT PLASMABLAD 3.0S (MISCELLANEOUS) ×2 IMPLANT
DRAPE C-ARM 42X72 X-RAY (DRAPES) ×8 IMPLANT
DRAPE HALF SHEET 40X57 (DRAPES) IMPLANT
DRAPE LAPAROTOMY 100X72X124 (DRAPES) ×4 IMPLANT
DURAPREP 26ML APPLICATOR (WOUND CARE) ×4 IMPLANT
DURASEAL APPLICATOR TIP (TIP) IMPLANT
DURASEAL SPINE SEALANT 3ML (MISCELLANEOUS) IMPLANT
ELECT REM PT RETURN 9FT ADLT (ELECTROSURGICAL) ×4
ELECTRODE REM PT RTRN 9FT ADLT (ELECTROSURGICAL) ×2 IMPLANT
GAUZE 4X4 16PLY RFD (DISPOSABLE) IMPLANT
GAUZE SPONGE 4X4 12PLY STRL (GAUZE/BANDAGES/DRESSINGS) IMPLANT
GLOVE BIO SURGEON STRL SZ7 (GLOVE) ×4 IMPLANT
GLOVE BIOGEL PI IND STRL 6.5 (GLOVE) ×4 IMPLANT
GLOVE BIOGEL PI IND STRL 8.5 (GLOVE) ×2 IMPLANT
GLOVE BIOGEL PI INDICATOR 6.5 (GLOVE) ×4
GLOVE BIOGEL PI INDICATOR 8.5 (GLOVE) ×2
GLOVE ECLIPSE 8.5 STRL (GLOVE) ×4 IMPLANT
GLOVE SURG SS PI 6.0 STRL IVOR (GLOVE) ×16 IMPLANT
GOWN STRL REUS W/ TWL LRG LVL3 (GOWN DISPOSABLE) ×4 IMPLANT
GOWN STRL REUS W/ TWL XL LVL3 (GOWN DISPOSABLE) IMPLANT
GOWN STRL REUS W/TWL 2XL LVL3 (GOWN DISPOSABLE) ×4 IMPLANT
GOWN STRL REUS W/TWL LRG LVL3 (GOWN DISPOSABLE) ×4
GOWN STRL REUS W/TWL XL LVL3 (GOWN DISPOSABLE)
HEMOSTAT POWDER KIT SURGIFOAM (HEMOSTASIS) ×4 IMPLANT
KIT BASIN OR (CUSTOM PROCEDURE TRAY) ×4 IMPLANT
KIT TURNOVER KIT B (KITS) ×4 IMPLANT
MILL MEDIUM DISP (BLADE) IMPLANT
NEEDLE HYPO 18GX1.5 BLUNT FILL (NEEDLE) ×4 IMPLANT
NEEDLE HYPO 22GX1.5 SAFETY (NEEDLE) ×4 IMPLANT
NEEDLE SPNL 25GX3.5 QUINCKE BL (NEEDLE) ×4 IMPLANT
NS IRRIG 1000ML POUR BTL (IV SOLUTION) ×4 IMPLANT
PACK CHEST (CUSTOM PROCEDURE TRAY) IMPLANT
PACK LAMINECTOMY NEURO (CUSTOM PROCEDURE TRAY) ×4 IMPLANT
PAD ARMBOARD 7.5X6 YLW CONV (MISCELLANEOUS) ×24 IMPLANT
PATTIES SURGICAL .5 X1 (DISPOSABLE) ×4 IMPLANT
PLASMABLADE 3.0S (MISCELLANEOUS) ×4
SPONGE LAP 4X18 RFD (DISPOSABLE) IMPLANT
SPONGE SURGIFOAM ABS GEL 100 (HEMOSTASIS) ×4 IMPLANT
SUT PROLENE 6 0 BV (SUTURE) IMPLANT
SUT VIC AB 1 CT1 18XBRD ANBCTR (SUTURE) ×2 IMPLANT
SUT VIC AB 1 CT1 8-18 (SUTURE) ×2
SUT VIC AB 2-0 CP2 18 (SUTURE) ×4 IMPLANT
SUT VIC AB 3-0 SH 8-18 (SUTURE) ×4 IMPLANT
SYR 3ML LL SCALE MARK (SYRINGE) ×4 IMPLANT
TOWEL GREEN STERILE (TOWEL DISPOSABLE) ×4 IMPLANT
TOWEL GREEN STERILE FF (TOWEL DISPOSABLE) ×4 IMPLANT
TRAY FOLEY MTR SLVR 16FR STAT (SET/KITS/TRAYS/PACK) ×4 IMPLANT
WATER STERILE IRR 1000ML POUR (IV SOLUTION) ×4 IMPLANT

## 2018-06-29 NOTE — Transfer of Care (Signed)
Immediate Anesthesia Transfer of Care Note  Patient: Matthew Kirk.  Procedure(s) Performed: Bilateral Lumbar five Sacral One Laminectomy/Foraminotomy with possible fusion (Bilateral Spine Lumbar)  Patient Location: PACU  Anesthesia Type:General  Level of Consciousness: drowsy and patient cooperative  Airway & Oxygen Therapy: Patient Spontanous Breathing and Patient connected to nasal cannula oxygen  Post-op Assessment: Report given to RN, Post -op Vital signs reviewed and stable and Patient moving all extremities  Post vital signs: Reviewed and stable  Last Vitals:  Vitals Value Taken Time  BP 153/92 06/29/2018  1:51 PM  Temp    Pulse 103 06/29/2018  1:52 PM  Resp 27 06/29/2018  1:52 PM  SpO2 97 % 06/29/2018  1:52 PM  Vitals shown include unvalidated device data.  Last Pain:  Vitals:   06/29/18 0909  TempSrc:   PainSc: 0-No pain         Complications: No apparent anesthesia complications

## 2018-06-29 NOTE — Op Note (Signed)
Date of surgery: June 29, 2018 Preoperative diagnosis: Myelosis with stenosis L5-S1 lumbar radiculopathy Postoperative diagnosis: Same Procedure: Bilateral laminotomies and foraminotomies, decompression of the L5 and S1 nerve roots. Surgeon: Barnett Abu Anesthesia: General endotracheal Indications: Matthew Kirk is a 68 year old individual whose had significant back and bilateral lower extremity pain he has had a previous decompression and fusion at L4-L5.  Because he has had persistent significant pain he was advised regarding surgical decompression via laminotomies and foraminotomies bilaterally.  He is now taken to the operating room for this procedure.  Procedure: The patient was brought to the operating room supine on the stretcher.  After the smooth induction of general endotracheal anesthesia, he was carefully turned prone.  The bony prominences were appropriately padded and protected.  The back was prepped with alcohol DuraPrep and draped in a sterile fashion.  Midline incision was created in the lower portion lumbar spine just below his incisions for his L4-L5 fusion.  Dissection was carried down to the lumbodorsal fascia which was opened on either side of midline and ultimately a radiographic confirmation of L5-S1 was obtained.  Interlaminar space at L5-S1 was then cleared bilaterally and a self-retaining retractor was positioned in this area.  Then by dissecting further I created laminotomies removing the inferior margin lamina of L5 out to and including the mesial wall of the facet first on the left side.  The thickened redundant yellow ligament was taken up and ultimately the common dural tube was exposed.  There is a significant cystic formation medially from the superior aspect of the facet joint and this was taken down and removed using a combination of 2 and 3 mm Kerrison punches.  This allowed for good decompression of the central and lateral recess at the L5-S1 zone.  The path of the L5  nerve root superiorly was sounded and found to be free and clear with this maneuver then S1 was decompressed in its path out the foramen just below and under the S1 nerve root there was noted to be a significant mass which ultimately went dissected out was fragments of disc herniation that occurred sometime ago these were somewhat scarred to the nerve root and were teased away from the nerve root itself to allow good decompression of the nerve root just as it enlarged to the area of the [place] root ganglion for S1.  Once this was decompressed of the opposite side was decompressed in a similar fashion creating a laminotomy and foraminotomy for the L5 and S1 nerve roots on the right side.  Once this was completed a total of 80 mg of Depo-Medrol was placed into the disc space along with 1/2 cc of half percent Marcaine.  And then another 25 cc of Marcaine was injected into the paraspinous muscle and fascia the retractors were removed the area was inspected carefully for hemostasis and the lumbodorsal fascia was closed with #1 Vicryl in interrupted fashion and 2-0 Vicryl was used in subcutaneous tissues and 3-0 Vicryl subcuticularly.  Dermabond was placed on the skin.  Patient tolerated procedure well was returned to recovery room in stable condition.  Blood loss was estimated at 75 mL

## 2018-06-29 NOTE — Social Work (Signed)
CSW acknowledging consult for SNF placement. Will follow for therapy recommendations.   Remy Voiles, MSW, LCSWA [place] [facility] [phone]   

## 2018-06-29 NOTE — Anesthesia Postprocedure Evaluation (Signed)
Anesthesia Post Note  Patient: Matthew Kirk.  Procedure(s) Performed: Bilateral Lumbar five Sacral One Laminectomy/Foraminotomy (Bilateral Spine Lumbar)     Patient location during evaluation: PACU Anesthesia Type: General Level of consciousness: awake and alert Pain management: pain level controlled Vital Signs Assessment: post-procedure vital signs reviewed and stable Respiratory status: spontaneous breathing, nonlabored ventilation, respiratory function stable and patient connected to nasal cannula oxygen Cardiovascular status: blood pressure returned to baseline and stable Postop Assessment: no apparent nausea or vomiting Anesthetic complications: no    Last Vitals:  Vitals:   06/29/18 1451 06/29/18 1513  BP: (!) 149/86 (!) 149/94  Pulse: 77 71  Resp: 15 16  Temp: (!) 36.3 C (!) 36.4 C  SpO2: 94% 96%    Last Pain:  Vitals:   06/29/18 1513  TempSrc: Oral  PainSc:                  Brytnee Bechler S

## 2018-06-29 NOTE — H&P (Signed)
Matthew Kirk. is an 68 y.o. male.   Chief Complaint: Back and bilateral lower extremity pain left worse than right HPI: Matthew Kirk is a 68 year old individuals had previous decompression and fusion at L4-L5.  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  Past Medical History:  Diagnosis Date  . GERD (gastroesophageal reflux disease)    takes otc prilosec - takes 3 x a week preventative  . H/O seasonal allergies   . History of kidney stones    last flare up 10-12 yrs ago.  Marland Kitchen HOH (hard of hearing)    loss of hearing over time  . Hypertension   . PONV (postoperative nausea and vomiting)     Past Surgical History:  Procedure Laterality Date  . BACK SURGERY     has had 2 on his lower back.    No family history on file. Social History:  reports that he quit smoking about 20 years ago. He quit smokeless tobacco use about 20 years ago. He reports that he does not drink alcohol or use drugs.  Allergies: No Known Allergies  No medications prior to admission.    No results found for this or any previous visit (from the past 48 hour(s)). No results found.  Review of Systems  Constitutional: Negative.   HENT: Negative.   Respiratory: Negative.   Cardiovascular: Negative.   Gastrointestinal: Negative.   Genitourinary: Negative.   Musculoskeletal: Positive for back pain.  Skin: Negative.   Neurological: Positive for tingling, sensory change and focal weakness.  Endo/Heme/Allergies: Negative.    Psychiatric/Behavioral: Negative.     There were no vitals taken for this visit. Physical Exam  Constitutional: He appears well-developed and well-nourished.  Musculoskeletal: Normal range of motion.  Neurological: He is alert.  Weakness is noted in the gastroc more on the left than on the right absent Achilles reflexes bilaterally.  Tibialis anterior strength is intact  Cranial nerve examination is normal Station is intact it reveals moderate antalgia on the left  Skin: Skin is warm and dry.  Psychiatric: He has a normal mood and affect. His behavior is normal. Judgment and thought content normal.     Assessment/Plan Spondylosis with lateral recess stenosis L5-S1  Plan: Decompression of L5-S1 possible fusion if instability is noted.  Matthew Dama, MD 06/29/2018, 7:47 AM

## 2018-06-29 NOTE — Anesthesia Preprocedure Evaluation (Signed)
Anesthesia Evaluation  Patient identified by MRN, date of birth, ID band  Reviewed: Allergy & Precautions, H&P , NPO status , Patient's Chart, lab work & pertinent test results  History of Anesthesia Complications (+) PONV and history of anesthetic complications  Airway Mallampati: II   Neck ROM: full    Dental   Pulmonary former smoker,    breath sounds clear to auscultation       Cardiovascular hypertension,  Rhythm:regular Rate:Normal     Neuro/Psych  Neuromuscular disease    GI/Hepatic GERD  ,  Endo/Other    Renal/GU      Musculoskeletal   Abdominal   Peds  Hematology   Anesthesia Other Findings   Reproductive/Obstetrics                             Anesthesia Physical Anesthesia Plan  ASA: II  Anesthesia Plan: General   Post-op Pain Management:    Induction: Intravenous  PONV Risk Score and Plan: 3 and Ondansetron, Dexamethasone and Treatment may vary due to age or medical condition  Airway Management Planned: Oral ETT  Additional Equipment:   Intra-op Plan:   Post-operative Plan: Extubation in OR  Informed Consent: I have reviewed the patients History and Physical, chart, labs and discussed the procedure including the risks, benefits and alternatives for the proposed anesthesia with the patient or authorized representative who has indicated his/her understanding and acceptance.       Plan Discussed with: Anesthesiologist, CRNA and Surgeon  Anesthesia Plan Comments:         Anesthesia Quick Evaluation

## 2018-06-29 NOTE — Anesthesia Procedure Notes (Signed)
Procedure Name: Intubation Date/Time: 06/29/2018 11:41 AM Performed by: Moshe Salisbury, CRNA Pre-anesthesia Checklist: Patient identified, Emergency Drugs available, Suction available and Patient being monitored Patient Re-evaluated:Patient Re-evaluated prior to induction Oxygen Delivery Method: Circle System Utilized Preoxygenation: Pre-oxygenation with 100% oxygen Induction Type: IV induction and Rapid sequence Laryngoscope Size: Mac and 4 Grade View: Grade II Tube type: Oral Tube size: 8.0 mm Number of attempts: 1 Airway Equipment and Method: Stylet Placement Confirmation: ETT inserted through vocal cords under direct vision,  positive ETCO2 and breath sounds checked- equal and bilateral Secured at: 23 cm Tube secured with: Tape Dental Injury: Teeth and Oropharynx as per pre-operative assessment

## 2018-06-29 NOTE — Evaluation (Signed)
Physical Therapy Evaluation Patient Details Name: Matthew Kirk. MRN: [id number] DOB: [birth year] Today's Date: 06/29/2018   History of Present Illness  Pt is a 68 y/o male s/p L5-S1 laminotomy and foraminotomy. PMH includes HTN, GERD, and back surgery.   Clinical Impression  Patient is s/p above surgery resulting in the deficits listed below (see PT Problem List). Pt requiring min guard A for mobility using RW. Educated about back precautions and generalized walking program. Patient will benefit from skilled PT to increase their independence and safety with mobility (while adhering to their precautions) to allow discharge to the venue listed below.     Follow Up Recommendations No PT follow up;Supervision for mobility/OOB    Equipment Recommendations  Other (comment)(TBD, may need RW depending on progression?)    Recommendations for Other Services       Precautions / Restrictions Precautions Precautions: Back Precaution Booklet Issued: No Precaution Comments: Verbally reviewed back precautions with pt.  Restrictions Weight Bearing Restrictions: No      Mobility  Bed Mobility Overal bed mobility: Needs Assistance Bed Mobility: Rolling;Sidelying to Sit;Sit to Sidelying Rolling: Supervision Sidelying to sit: Supervision     Sit to sidelying: Supervision General bed mobility comments: Supervision for safety. Cues for log roll technique   Transfers Overall transfer level: Needs assistance Equipment used: Rolling walker (2 wheeled) Transfers: Sit to/from Stand Sit to Stand: Min guard         General transfer comment: Min guard for safety. Cues for hand placement.   Ambulation/Gait Ambulation/Gait assistance: Min guard Gait Distance (Feet): 100 Feet Assistive device: Rolling walker (2 wheeled) Gait Pattern/deviations: Step-through pattern;Decreased stride length Gait velocity: Decreased    General Gait Details: Slow, guarded gait. Overall steady using RW.  Educated about generalized walking program to perfom at home.   Stairs            Wheelchair Mobility    Modified Rankin (Stroke Patients Only)       Balance Overall balance assessment: Needs assistance Sitting-balance support: No upper extremity supported;Feet supported Sitting balance-Leahy Scale: Fair     Standing balance support: Bilateral upper extremity supported;During functional activity Standing balance-Leahy Scale: Poor Standing balance comment: Reliant on BUE support                              Pertinent Vitals/Pain Pain Assessment: Faces Faces Pain Scale: Hurts a little bit Pain Location: back Pain Descriptors / Indicators: Aching;Operative site guarding Pain Intervention(s): Limited activity within patient's tolerance;Monitored during session;Repositioned    Home Living Family/patient expects to be discharged to:: Private residence Living Arrangements: Spouse/significant other Available Help at Discharge: Family;Available 24 hours/day Type of Home: House Home Access: Stairs to enter Entrance Stairs-Rails: None Entrance Stairs-Number of Steps: 3 Home Layout: One level Home Equipment: Cane - single point      Prior Function Level of Independence: Independent with assistive device(s)         Comments: Reports using cane for ambulation      Hand Dominance        Extremity/Trunk Assessment   Upper Extremity Assessment Upper Extremity Assessment: Defer to OT evaluation    Lower Extremity Assessment Lower Extremity Assessment: RLE deficits/detail RLE Deficits / Details: Reports improvement in RLE pain     Cervical / Trunk Assessment Cervical / Trunk Assessment: Other exceptions Cervical / Trunk Exceptions: s/p Lumbar surgery   Communication   Communication: No difficulties  Cognition Arousal/Alertness: Awake/alert  Behavior During Therapy: WFL for tasks assessed/performed Overall Cognitive Status: Within Functional Limits  for tasks assessed                                        General Comments      Exercises     Assessment/Plan    PT Assessment Patient needs continued PT services  PT Problem List Decreased strength;Decreased mobility;Decreased knowledge of use of DME;Decreased knowledge of precautions;Pain       PT Treatment Interventions DME instruction;Gait training;Stair training;Functional mobility training;Therapeutic activities;Balance training;Therapeutic exercise;Patient/family education    PT Goals (Current goals can be found in the Care Plan section)  Acute Rehab PT Goals Patient Stated Goal: to go home PT Goal Formulation: With patient Time For Goal Achievement: 07/13/18 Potential to Achieve Goals: Good    Frequency Min 5X/week   Barriers to discharge        Co-evaluation               AM-PAC PT "6 Clicks" Mobility  Outcome Measure Help needed turning from your back to your side while in a flat bed without using bedrails?: A Little Help needed moving from lying on your back to sitting on the side of a flat bed without using bedrails?: A Little Help needed moving to and from a bed to a chair (including a wheelchair)?: A Little Help needed standing up from a chair using your arms (e.g., wheelchair or bedside chair)?: A Little Help needed to walk in hospital room?: A Little Help needed climbing 3-5 steps with a railing? : A Little 6 Click Score: 18    End of Session Equipment Utilized During Treatment: Gait belt Activity Tolerance: Patient tolerated treatment well Patient left: in bed;with call bell/phone within reach Nurse Communication: Mobility status PT Visit Diagnosis: Other abnormalities of gait and mobility (R26.89);Pain Pain - part of body: (back)    Time: 6237-6283 PT Time Calculation (min) (ACUTE ONLY): 17 min   Charges:   PT Evaluation $PT Eval Low Complexity: 1 Low          Gladys Damme, PT, DPT  Acute Rehabilitation  Services  Pager: [phone] Office: [phone]   Lehman Prom 06/29/2018, 4:59 PM

## 2018-06-30 ENCOUNTER — Encounter (HOSPITAL_COMMUNITY): Payer: Self-pay | Admitting: Neurological Surgery

## 2018-06-30 MED ORDER — HYDROCODONE-ACETAMINOPHEN 5-325 MG PO TABS: 1.0000 | ORAL_TABLET | ORAL | 0 refills | Status: DC | PRN

## 2018-06-30 NOTE — Progress Notes (Signed)
Pt with d/c orders. IV removed. Discharge paperwork reviewed with patient and all questions answered. Pt escorted to vehicle via wheelchair with all belongings.

## 2018-06-30 NOTE — Evaluation (Signed)
Occupational Therapy Evaluation Patient Details Name: Matthew Kirk. MRN: [id number] DOB: April 04, 1950 Today's Date: 06/30/2018    History of Present Illness 68 y/o male s/p L5-S1 laminotomy and foraminotomy. PMH includes HTN, GERD, and back surgery.    Clinical Impression   Patient evaluated by Occupational Therapy with no further acute OT needs identified. All education has been completed and the patient has no further questions. See below for any follow-up Occupational Therapy or equipment needs. OT to sign off. Thank you for referral.      Follow Up Recommendations  No OT follow up    Equipment Recommendations  None recommended by OT    Recommendations for Other Services       Precautions / Restrictions Precautions Precautions: Back Precaution Booklet Issued: No Precaution Comments: verbalized back precautions  Restrictions Weight Bearing Restrictions: No      Mobility Bed Mobility Overal bed mobility: Modified Independent Bed Mobility: Rolling;Sidelying to Sit Rolling: Modified independent (Device/Increase time) Sidelying to sit: Modified independent (Device/Increase time)       General bed mobility comments: in chair on arrival  Transfers Overall transfer level: Needs assistance Equipment used: None Transfers: Sit to/from Stand Sit to Stand: Modified independent (Device/Increase time)         General transfer comment: avoid lifting entire body with arms but rather scoot to front of chair    Balance Overall balance assessment: Needs assistance Sitting-balance support: Feet supported;No upper extremity supported Sitting balance-Leahy Scale: Good     Standing balance support: No upper extremity supported Standing balance-Leahy Scale: Good                             ADL either performed or assessed with clinical judgement   ADL Overall ADL's : Modified independent                                       General  ADL Comments: pt educated on back precautions with adls. pt  attempting to twist during session and cued to proper alignment. pt educated on scooting instead of lifting body with arms. p t demonstrates shower transfer. pt educated on mask don doff proper as patient has mask in room at this time.      Vision Baseline Vision/History: No visual deficits       Perception     Praxis      Pertinent Vitals/Pain Pain Assessment: Faces Faces Pain Scale: Hurts a little bit Pain Location: back Pain Descriptors / Indicators: Sore Pain Intervention(s): Premedicated before session;Monitored during session;Repositioned     Hand Dominance Right   Extremity/Trunk Assessment Upper Extremity Assessment Upper Extremity Assessment: Overall WFL for tasks assessed   Lower Extremity Assessment Lower Extremity Assessment: LLE deficits/detail RLE Deficits / Details: Reports improvement in RLE pain  LLE Sensation: decreased proprioception;decreased light touch   Cervical / Trunk Assessment Cervical / Trunk Assessment: Other exceptions Cervical / Trunk Exceptions: s/p Lumbar surgery    Communication Communication Communication: No difficulties   Cognition Arousal/Alertness: Awake/alert Behavior During Therapy: WFL for tasks assessed/performed Overall Cognitive Status: Within Functional Limits for tasks assessed                                     General Comments  wound drainage present and MD  arriving at end of session    Exercises     Shoulder Instructions      Home Living Family/patient expects to be discharged to:: Private residence Living Arrangements: Spouse/significant other Available Help at Discharge: Family;Available 24 hours/day Type of Home: House Home Access: Stairs to enter Enter[employer] CorporationEntrance Stairs-Number of Steps: 3 Entrance Stairs-Rails: None Home Layout: One level     Bathroom Shower/Tub: Producer, television/film/videoWalk-in shower   Bathroom Toilet: Handicapped height     Home  Equipment: Cane - single point   Additional Comments: wife (A) with LB showering      Prior Functioning/Environment Level of Independence: Independent with assistive device(s)        Comments: Reports using cane for ambulation         OT Problem List:        OT Treatment/Interventions:      OT Goals(Current goals can be found in the care plan section) Acute Rehab OT Goals Patient Stated Goal: to go home TODAY  OT Frequency:     Barriers to D/C:            Co-evaluation              AM-PAC OT "6 Clicks" Daily Activity     Outcome Measure Help from another person eating meals?: None Help from another person taking care of personal grooming?: None Help from another person toileting, which includes using toliet, bedpan, or urinal?: None Help from another person bathing (including washing, rinsing, drying)?: None Help from another person to put on and taking off regular upper body clothing?: None Help from another person to put on and taking off regular lower body clothing?: None 6 Click Score: 24   End of Session Nurse Communication: Mobility status;Precautions  Activity Tolerance: Patient tolerated treatment well Patient left: in chair;with call bell/phone within reach  OT Visit Diagnosis: Unsteadiness on feet (R26.81)                Time: [id number] OT Time Calculation (min): 32 min Charges:  OT General Charges $OT Visit: 1 Visit OT Evaluation $OT Eval Moderate Complexity: 1 Mod OT Treatments $Self Care/Home Management : 8-22 mins   Matthew Kirk, OTR/L  Acute Rehabilitation Services Pager: [phone] Office: [phone] .   Matthew Kirk 06/30/2018, 10:21 AM

## 2018-06-30 NOTE — Progress Notes (Signed)
Physical Therapy Treatment Patient Details Name: Matthew R Manning Jr. MRDaryel November: [id number] DOB: May 31, 1950 Today's Date: 06/30/2018    History of Present Illness Pt is a 68 y/o male s/p L5-S1 laminotomy and foraminotomy. PMH includes HTN, GERD, and back surgery.     PT Comments    PT progressing well towards independence. Pt unable to do complete L single leg heal raise. Pt reports " i've been trying to work it at the gym but I can't gain strength." Educated pt to discuss outpt PT with Dr. Danielle DessElsner at follow up visit. Pt safe to d/c home with spouse once medically stable. Pt with minimal pain and able to amb and complete stair negotiation without physical assist or AD.    Follow Up Recommendations  No PT follow up(benefit from outpt for L LE weakness once cleared by MD)     Equipment Recommendations  None recommended by PT    Recommendations for Other Services       Precautions / Restrictions Precautions Precautions: Back Precaution Booklet Issued: No Precaution Comments: verbally reviewed, pt reports "this is my 3rd surgery" pt demonstrating 3/3 back precautions Restrictions Weight Bearing Restrictions: No    Mobility  Bed Mobility Overal bed mobility: Modified Independent Bed Mobility: Rolling;Sidelying to Sit Rolling: Modified independent (Device/Increase time) Sidelying to sit: Modified independent (Device/Increase time)       General bed mobility comments: pt with HOB elevated, reports "I have an adjustable bed at home, demonstrated good log roll  Transfers Overall transfer level: Needs assistance Equipment used: None Transfers: Sit to/from Stand Sit to Stand: Modified independent (Device/Increase time)         General transfer comment: pt with good technique, used bilat LEs, minimal bending  Ambulation/Gait Ambulation/Gait assistance: Modified independent (Device/Increase time) Gait Distance (Feet): 200 Feet Assistive device: None Gait Pattern/deviations:  Step-through pattern;Decreased stride length;Wide base of support Gait velocity: decreased but appropriate   General Gait Details: slow and guarded but no loss of balance, able to stand upright   Stairs Stairs: Yes Stairs assistance: Supervision Stair Management: No rails;Alternating pattern Number of Stairs: 10 General stair comments: pt leads with L LE down due to mild weakness   Wheelchair Mobility    Modified Rankin (Stroke Patients Only)       Balance Overall balance assessment: Needs assistance Sitting-balance support: Feet supported;No upper extremity supported Sitting balance-Leahy Scale: Good     Standing balance support: No upper extremity supported Standing balance-Leahy Scale: Good                              Cognition Arousal/Alertness: Awake/alert Behavior During Therapy: WFL for tasks assessed/performed Overall Cognitive Status: Within Functional Limits for tasks assessed                                        Exercises      General Comments General comments (skin integrity, edema, etc.): VSS      Pertinent Vitals/Pain Pain Assessment: Faces Faces Pain Scale: Hurts a little bit Pain Location: back Pain Descriptors / Indicators: Sore Pain Intervention(s): Monitored during session    Home Living                      Prior Function            PT Goals (current goals can now  be found in the care plan section) Acute Rehab PT Goals Patient Stated Goal: to go home TODAY Progress towards PT goals: Progressing toward goals    Frequency    Min 5X/week      PT Plan Current plan remains appropriate    Co-evaluation              AM-PAC PT "6 Clicks" Mobility   Outcome Measure  Help needed turning from your back to your side while in a flat bed without using bedrails?: None Help needed moving from lying on your back to sitting on the side of a flat bed without using bedrails?: None Help needed  moving to and from a bed to a chair (including a wheelchair)?: None Help needed standing up from a chair using your arms (e.g., wheelchair or bedside chair)?: None Help needed to walk in hospital room?: None Help needed climbing 3-5 steps with a railing? : A Little 6 Click Score: 23    End of Session   Activity Tolerance: Patient tolerated treatment well Patient left: in chair;with call bell/phone within reach;with nursing/sitter in room Nurse Communication: Mobility status PT Visit Diagnosis: Muscle weakness (generalized) (M62.81)     Time: 9417-4081 PT Time Calculation (min) (ACUTE ONLY): 15 min  Charges:  $Gait Training: 8-22 mins                     Lewis Shock, PT, DPT Acute Rehabilitation Services Pager #: [phone] Office #: [phone]    Iona Hansen 06/30/2018, 7:42 AM

## 2018-06-30 NOTE — Discharge Summary (Signed)
Physician Discharge Summary  Patient ID: Matthew Kirk. MRN: [id number] DOB/AGE: 67-05-1950 67 y.o.  Admit date: 06/29/2018 Discharge date: 06/30/2018  Admission Diagnoses: Spondylosis L5-S1 with radiculopathy.  Lumbar stenosis  Discharge Diagnoses: Spondylosis L5-S1 with radiculopathy.  Lumbar stenosis.  History of fusion L4-L5 Active Problems:   Lumbar stenosis with neurogenic claudication   Discharged Condition: good  Hospital Course: Patient tolerated surgery well  Consults: None  Significant Diagnostic Studies: None  Treatments: surgery: Bilateral laminotomies and foraminotomies L5-S1  Discharge Exam: Blood pressure 123/78, pulse 79, temperature 97.7 F (36.5 C), temperature source Oral, resp. rate 18, height 6\' 3"  (1.905 m), weight 120.6 kg, SpO2 97 %. Incision is clean and dry motor function is intact Station and gait are intact  Disposition: Discharge disposition: 01-Home or Self Care       Discharge Instructions    Call MD for:  redness, tenderness, or signs of infection (pain, swelling, redness, odor or green/yellow discharge around incision site)   Complete by:  As directed    Call MD for:  severe uncontrolled pain   Complete by:  As directed    Call MD for:  temperature >100.4   Complete by:  As directed    Diet - low sodium heart healthy   Complete by:  As directed    Discharge instructions   Complete by:  As directed    Okay to shower. Do not apply salves or appointments to incision. No heavy lifting with the upper extremities greater than 15 pounds. May resume driving when not requiring pain medication and patient feels comfortable with doing so.   Incentive spirometry RT   Complete by:  As directed    Increase activity slowly   Complete by:  As directed    Nursing communication   Complete by:  As directed    Please provide 4 x 4 gauze and tape for 4 dressing changes at time of discharge     Allergies as of 06/30/2018   No Known Allergies      Medication List    TAKE these medications   Centrum Silver 50+Men Tabs Take 1 tablet by mouth at bedtime.   cyanocobalamin 1000 MCG/ML injection Commonly known as:  (VITAMIN B-12) Inject 1,000 mcg into the muscle every 30 (thirty) days.   Fish Oil Triple Strength 1400 MG Caps Take 1,400 mg by mouth daily.   HYDROcodone-acetaminophen 5-325 MG tablet Commonly known as:  Norco Take 1-2 tablets by mouth every 4 (four) hours as needed for moderate pain or severe pain.   losartan 25 MG tablet Commonly known as:  COZAAR Take 50 mg by mouth daily.   vitamin C 1000 MG tablet Take 1,000 mg by mouth daily.   Vitamin D3 50 MCG (2000 UT) Tabs Take 6,000 Units by mouth daily.        Signed: Shary Key Porschea Borys 06/30/2018, 10:07 AM

## 2018-06-30 NOTE — Progress Notes (Signed)
Patient ID: Matthew Kirk., male   DOB: 06/23/1950, 68 y.o.   MRN: [id number] Vital signs are stable Patient feels well after surgery Is a minimal symptoms in his back or his legs There is a slight amount of drainage from his back We will discharge today Spoke with wife

## 2018-10-23 ENCOUNTER — Other Ambulatory Visit: Payer: Self-pay | Admitting: Neurological Surgery

## 2018-10-23 DIAGNOSIS — M5416 Radiculopathy, lumbar region: Secondary | ICD-10-CM

## 2018-11-09 ENCOUNTER — Ambulatory Visit
Admission: RE | Admit: 2018-11-09 | Discharge: 2018-11-09 | Disposition: A | Discharge status: Home / Self Care | Payer: Medicare HMO | Source: Ambulatory Visit | Attending: Neurological Surgery | Admitting: Neurological Surgery

## 2018-11-09 DIAGNOSIS — M5416 Radiculopathy, lumbar region: Secondary | ICD-10-CM

## 2018-11-09 MED ORDER — GADOBENATE DIMEGLUMINE 529 MG/ML IV SOLN
20.0000 mL | Freq: Once | INTRAVENOUS | Status: AC | PRN
  Administered 2018-11-09: 20 mL via INTRAVENOUS

## 2018-11-23 ENCOUNTER — Other Ambulatory Visit: Payer: Medicare HMO

## 2019-01-05 ENCOUNTER — Other Ambulatory Visit (HOSPITAL_COMMUNITY): Payer: Self-pay | Admitting: Neurological Surgery

## 2019-01-05 ENCOUNTER — Other Ambulatory Visit: Payer: Self-pay | Admitting: Neurological Surgery

## 2019-01-05 DIAGNOSIS — M5416 Radiculopathy, lumbar region: Secondary | ICD-10-CM

## 2019-01-07 ENCOUNTER — Other Ambulatory Visit: Payer: Self-pay

## 2019-01-07 ENCOUNTER — Ambulatory Visit (HOSPITAL_COMMUNITY)
Admission: RE | Admit: 2019-01-07 | Discharge: 2019-01-07 | Disposition: A | Discharge status: Home / Self Care | Payer: Medicare HMO | Source: Ambulatory Visit | Attending: Neurological Surgery | Admitting: Neurological Surgery

## 2019-01-07 ENCOUNTER — Other Ambulatory Visit: Payer: Self-pay | Admitting: Neurological Surgery

## 2019-01-07 DIAGNOSIS — M4804 Spinal stenosis, thoracic region: Secondary | ICD-10-CM | POA: Insufficient documentation

## 2019-01-07 DIAGNOSIS — M25551 Pain in right hip: Secondary | ICD-10-CM | POA: Diagnosis not present

## 2019-01-07 DIAGNOSIS — M5116 Intervertebral disc disorders with radiculopathy, lumbar region: Secondary | ICD-10-CM | POA: Insufficient documentation

## 2019-01-07 DIAGNOSIS — M4807 Spinal stenosis, lumbosacral region: Secondary | ICD-10-CM | POA: Diagnosis not present

## 2019-01-07 DIAGNOSIS — M5416 Radiculopathy, lumbar region: Secondary | ICD-10-CM

## 2019-01-07 MED ORDER — HYDROCODONE-ACETAMINOPHEN 5-325 MG PO TABS: 1.0000 | ORAL_TABLET | ORAL | Status: DC | PRN

## 2019-01-07 MED ORDER — LIDOCAINE HCL (PF) 1 % IJ SOLN
5.0000 mL | Freq: Once | INTRAMUSCULAR | Status: AC
  Administered 2019-01-07: 11:00:00 2 mL via INTRADERMAL

## 2019-01-07 MED ORDER — ONDANSETRON HCL 4 MG/2ML IJ SOLN: 4.0000 mg | Freq: Four times a day (QID) | INTRAMUSCULAR | Status: DC | PRN

## 2019-01-07 MED ORDER — IOHEXOL 180 MG/ML  SOLN
20.0000 mL | Freq: Once | INTRAMUSCULAR | Status: AC | PRN
  Administered 2019-01-07: 12 mL via INTRATHECAL

## 2019-01-07 NOTE — Procedures (Signed)
Matthew Kirk is a 68 year old individual who has had significant back and right lower extremity pain for nearly a year's time.  Back in April he underwent decompression at L5-S1 on the right side for foraminal stenosis.  Postoperatively has had little relief of pain gradually notes the pain seems to be worsening he also has weakness on the left leg which he notes predates his surgery 3 years ago he had a decompression and fusion at L4-L5.  Notes he did well after surgery but about a year ago he was lifting some mulch and doing some repetitive bending and lifting and this aggravated pain that has since not ceased.  An M RI had been performed which demonstrates that he has a good decompression the lateral gutter however because of the concern about the hardware being in place at L4 and L5 myelogram postmyelogram CAT scan has been suggested to evaluate his spine further.  Pre op Dx: Chronic right lumbar radiculopathy Post op Dx: Same, history of fusion L4-L5 Procedure: Lumbar myelogram Surgeon: Bryley Chrisman Puncture level: Lumbar 3 4 Fluid color: Clear colorless Injection: Iohexol 240, 12 mL Findings: Motion between L4 and L5 suggesting pseudoarthrosis no canal stenosis further evaluation with CT scanning.

## 2019-01-07 NOTE — Discharge Instructions (Signed)
Myelogram and Lumbar Puncture Discharge Instructions  1. Go home and rest quietly for the next 24 hours.  It is important to lie flat for the next 24 hours.  Get up only to go to the restroom.  You may lie in the bed or on a couch on your back, your stomach, your left side or your right side.  You may have one pillow under your head.  You may have pillows between your knees while you are on your side or under your knees while you are on your back.  2. DO NOT drive today.  Recline the seat as far back as it will go, while still wearing your seat belt, on the way home.  3. You may get up to go to the bathroom as needed.  You may sit up for 10 minutes to eat.  You may resume your normal diet and medications unless otherwise indicated.  4. The incidence of headache, nausea, or vomiting is about 5% (one in 20 patients).  If you develop a headache, lie flat and drink plenty of fluids until the headache goes away.  Caffeinated beverages may be helpful.  If you develop severe nausea and vomiting or a headache that does not go away with flat bed rest, call ordering physician 5.    6. You may resume normal activities after your 24 hours of bed rest is over; however, do not exert yourself strongly or do any heavy lifting tomorrow.  7. Call your physician for a follow-up appointment.  The results of your myelogram will be sent directly to your physician by the following day.  8. If you have any questions or if complications develop after you arrive home, please call ordering physician  Discharge instructions have been explained to the patient.  The patient, or the person responsible for the patient, fully understands these instructions.

## 2019-01-12 ENCOUNTER — Other Ambulatory Visit: Payer: Self-pay | Admitting: Neurological Surgery

## 2019-01-21 NOTE — Progress Notes (Signed)
[id number] - [place], [place] - [address] [place] [address] Phone: [phone] Fax: [phone]      Your procedure is scheduled on Tuesday, November 3rd, 2020.   Report to [facility] Main Entrance "A" at 9:45 A.M., and check in at the Admitting office.   Call this number if you have problems the morning of surgery:  [phone]  Call [phone] if you have any questions prior to your surgery date Monday-Friday 8am-4pm    Remember:  Do not eat or drink after midnight the night before your surgery    Take NO medicines the morning of surgery.  7 days prior to surgery STOP taking any Aspirin (unless otherwise instructed by your surgeon), Aleve, Naproxen, Ibuprofen, Motrin, Advil, Goody's, BC's, all herbal medications, fish oil, and all vitamins.    The Morning of Surgery  Do not wear jewelry.  Do not wear lotions, powders, colognes, or deodorant  Do not shave 48 hours prior to surgery.  Men may shave face and neck.  Do not bring valuables to the hospital.  [facility] is not responsible for any belongings or valuables.  If you are a smoker, DO NOT Smoke 24 hours prior to surgery IF you wear a CPAP at night please bring your mask, tubing, and machine the morning of surgery   Remember that you must have someone to transport you home after your surgery, and remain with you for 24 hours if you are discharged the same day.   Contacts, glasses, hearing aids, dentures or bridgework may not be worn into surgery.    Leave your suitcase in the car.  After surgery it may be brought to your room.  For patients admitted to the hospital, discharge time will be determined by your treatment team.  Patients discharged the day of surgery will not be allowed to drive home.    Special instructions:   [place]- Preparing For Surgery  Before surgery, you can play an important role. Because skin is not  sterile, your skin needs to be as free of germs as possible. You can reduce the number of germs on your skin by washing with CHG (chlorahexidine gluconate) Soap before surgery.  CHG is an antiseptic cleaner which kills germs and bonds with the skin to continue killing germs even after washing.    Oral Hygiene is also important to reduce your risk of infection.  Remember - BRUSH YOUR TEETH THE MORNING OF SURGERY WITH YOUR REGULAR TOOTHPASTE  Please do not use if you have an allergy to CHG or antibacterial soaps. If your skin becomes reddened/irritated stop using the CHG.  Do not shave (including legs and underarms) for at least 48 hours prior to first CHG shower. It is OK to shave your face.  Please follow these instructions carefully.   1. Shower the NIGHT BEFORE SURGERY and the MORNING OF SURGERY with CHG Soap.   2. If you chose to wash your hair, wash your hair first as usual with your normal shampoo.  3. After you shampoo, rinse your hair and body thoroughly to remove the shampoo.  4. Use CHG as you would any other liquid soap. You can apply CHG directly to the skin and wash gently with a scrungie or a clean washcloth.   5. Apply the CHG Soap to your body ONLY FROM THE NECK DOWN.  Do not use on open wounds or open sores. Avoid contact  with your eyes, ears, mouth and genitals (private parts). Wash Face and genitals (private parts)  with your normal soap.   6. Wash thoroughly, paying special attention to the area where your surgery will be performed.  7. Thoroughly rinse your body with warm water from the neck down.  8. DO NOT shower/wash with your normal soap after using and rinsing off the CHG Soap.  9. Pat yourself dry with a CLEAN TOWEL.  10. Wear CLEAN PAJAMAS to bed the night before surgery, wear comfortable clothes the morning of surgery  11. Place CLEAN SHEETS on your bed the night of your first shower and DO NOT SLEEP WITH PETS.    Day of Surgery:  Do not apply any  deodorants/lotions. Please shower the morning of surgery with the CHG soap  Please wear clean clothes to the hospital/surgery center.   Remember to brush your teeth WITH YOUR REGULAR TOOTHPASTE.   Please read over the following fact sheets that you were given.

## 2019-01-22 ENCOUNTER — Other Ambulatory Visit (HOSPITAL_COMMUNITY)
Admission: RE | Admit: 2019-01-22 | Discharge: 2019-01-22 | Disposition: A | Discharge status: Home / Self Care | Payer: Medicare HMO | Source: Ambulatory Visit | Attending: Neurological Surgery | Admitting: Neurological Surgery

## 2019-01-22 ENCOUNTER — Other Ambulatory Visit: Payer: Self-pay

## 2019-01-22 ENCOUNTER — Encounter (HOSPITAL_COMMUNITY): Payer: Self-pay

## 2019-01-22 ENCOUNTER — Encounter (HOSPITAL_COMMUNITY)
Admission: RE | Admit: 2019-01-22 | Discharge: 2019-01-22 | Disposition: A | Discharge status: Home / Self Care | Payer: Medicare HMO | Source: Ambulatory Visit | Attending: Neurological Surgery | Admitting: Neurological Surgery

## 2019-01-22 DIAGNOSIS — K219 Gastro-esophageal reflux disease without esophagitis: Secondary | ICD-10-CM | POA: Insufficient documentation

## 2019-01-22 DIAGNOSIS — I10 Essential (primary) hypertension: Secondary | ICD-10-CM | POA: Diagnosis not present

## 2019-01-22 DIAGNOSIS — Z01812 Encounter for preprocedural laboratory examination: Secondary | ICD-10-CM | POA: Diagnosis present

## 2019-01-22 DIAGNOSIS — Z79899 Other long term (current) drug therapy: Secondary | ICD-10-CM | POA: Insufficient documentation

## 2019-01-22 DIAGNOSIS — Z87891 Personal history of nicotine dependence: Secondary | ICD-10-CM | POA: Insufficient documentation

## 2019-01-22 LAB — CBC
HCT: 45.3 % (ref 39.0–52.0)
Hemoglobin: 15.9 g/dL (ref 13.0–17.0)
MCH: 32.5 pg (ref 26.0–34.0)
MCHC: 35.1 g/dL (ref 30.0–36.0)
MCV: 92.6 fL (ref 80.0–100.0)
Platelets: 210 10*3/uL (ref 150–400)
RBC: 4.89 MIL/uL (ref 4.22–5.81)
RDW: 12.4 % (ref 11.5–15.5)
WBC: 6.5 10*3/uL (ref 4.0–10.5)
nRBC: 0 % (ref 0.0–0.2)

## 2019-01-22 LAB — BASIC METABOLIC PANEL
Anion gap: 8 (ref 5–15)
BUN: 18 mg/dL (ref 8–23)
CO2: 24 mmol/L (ref 22–32)
Calcium: 9.7 mg/dL (ref 8.9–10.3)
Chloride: 106 mmol/L (ref 98–111)
Creatinine, Ser: 1.13 mg/dL (ref 0.61–1.24)
GFR calc Af Amer: >60 mL/min (ref >60)
GFR calc non Af Amer: >60 mL/min (ref >60)
Glucose, Bld: 164 mg/dL — ABNORMAL HIGH (ref 70–99)
Potassium: 4.3 mmol/L (ref 3.5–5.1)
Sodium: 138 mmol/L (ref 135–145)

## 2019-01-22 LAB — TYPE AND SCREEN
ABO/RH(D): A POS
Antibody Screen: NEGATIVE

## 2019-01-22 LAB — SURGICAL PCR SCREEN
MRSA, PCR: NEGATIVE
Staphylococcus aureus: POSITIVE — AB

## 2019-01-22 NOTE — Progress Notes (Signed)
PCP - Chesley Noon, MD Cardiologist - denies  Chest x-ray - N/A EKG - 06/19/18  Stress Test - denies ECHO - denies Cardiac Cath - denies  Blood Thinner Instructions: N/A Aspirin Instructions:N/A  COVID TEST- scheduled after PAT; patient informed of need for self-quarantine and states verbal understanding   Coronavirus Screening  Have you experienced the following symptoms:  Cough yes/no: No Fever (>100.106F)  yes/no: No Runny nose yes/no: No Sore throat yes/no: No Difficulty breathing/shortness of breath  yes/no: No  Have you or a family member traveled in the last 14 days and where? yes/no: No  If the patient indicates "YES" to the above questions, their PAT will be rescheduled to limit the exposure to others and, the surgeon will be notified. THE PATIENT WILL NEED TO BE ASYMPTOMATIC FOR 14 DAYS.   If the patient is not experiencing any of these symptoms, the PAT nurse will instruct them to NOT bring anyone with them to their appointment since they may have these symptoms or traveled as well.   Please remind your patients and families that hospital visitation restrictions are in effect and the importance of the restrictions.   Anesthesia review: No  Patient denies shortness of breath, fever, cough and chest pain at PAT appointment   All instructions explained to the patient, with a verbal understanding of the material. Patient agrees to go over the instructions while at home for a better understanding. Patient also instructed to self quarantine after being tested for COVID-19. The opportunity to ask questions was provided.

## 2019-01-22 NOTE — Progress Notes (Signed)
   01/22/19 0928  OBSTRUCTIVE SLEEP APNEA  Have you ever been diagnosed with sleep apnea through a sleep study? No  Do you snore loudly (loud enough to be heard through closed doors)?  1  Do you often feel tired, fatigued, or sleepy during the daytime (such as falling asleep during driving or talking to someone)? 0  Has anyone observed you stop breathing during your sleep? 0  Do you have, or are you being treated for high blood pressure? 1  BMI more than 35 kg/m2? 0  Age > 50 (1-yes) 1  Neck circumference greater than:Male 16 inches or larger, Male 17inches or larger? 1  Male Gender (Yes=1) 1  Obstructive Sleep Apnea Score 5

## 2019-01-23 LAB — NOVEL CORONAVIRUS, NAA (HOSP ORDER, SEND-OUT TO REF LAB; TAT 18-24 HRS): SARS-CoV-2, NAA: NOT DETECTED

## 2019-01-25 MED ORDER — DEXTROSE 5 % IV SOLN
3.0000 g | INTRAVENOUS | Status: AC
  Administered 2019-01-26: 3 g via INTRAVENOUS
  Filled 2019-01-25: qty 3

## 2019-01-25 NOTE — Anesthesia Preprocedure Evaluation (Signed)
Anesthesia Evaluation  Patient identified by MRN, date of birth, ID band  Reviewed: Allergy & Precautions, H&P , NPO status , Patient's Chart, lab work & pertinent test results  History of Anesthesia Complications (+) PONV and history of anesthetic complications  Airway Mallampati: II  TM Distance: >3 FB Neck ROM: full    Dental no notable dental hx.    Pulmonary former smoker,  Quit smoking 2000   Pulmonary exam normal breath sounds clear to auscultation       Cardiovascular hypertension, Pt. on medications Normal cardiovascular exam Rhythm:regular Rate:Normal     Neuro/Psych  Neuromuscular disease negative psych ROS   GI/Hepatic Neg liver ROS, GERD  Medicated and Controlled,  Endo/Other  Obesity BMI 35  Renal/GU Hx nephrolithiasis  negative genitourinary   Musculoskeletal  (+) Arthritis , Osteoarthritis,  Chronic lumbar radiculopathy, lumbar stenosis with neurogenic claudication   Abdominal (+) + obese,   Peds  Hematology negative hematology ROS (+)   Anesthesia Other Findings HOH  Reproductive/Obstetrics negative OB ROS                             Anesthesia Physical  Anesthesia Plan  ASA: III  Anesthesia Plan: General   Post-op Pain Management:    Induction: Intravenous  PONV Risk Score and Plan: 3 and Ondansetron, Dexamethasone, Treatment may vary due to age or medical condition, Midazolam and Scopolamine patch - Pre-op  Airway Management Planned: Oral ETT  Additional Equipment: None  Intra-op Plan:   Post-operative Plan: Extubation in OR  Informed Consent: I have reviewed the patients History and Physical, chart, labs and discussed the procedure including the risks, benefits and alternatives for the proposed anesthesia with the patient or authorized representative who has indicated his/her understanding and acceptance.     Dental advisory given  Plan Discussed  with: CRNA  Anesthesia Plan Comments:         Anesthesia Quick Evaluation

## 2019-01-26 ENCOUNTER — Inpatient Hospital Stay (HOSPITAL_COMMUNITY)
Admission: AD | Admit: 2019-01-26 | Discharge: 2019-01-28 | DRG: 455 | Disposition: A | Discharge status: Home / Self Care | Payer: Medicare HMO | Source: Ambulatory Visit | Attending: Neurological Surgery | Admitting: Neurological Surgery

## 2019-01-26 ENCOUNTER — Ambulatory Visit (HOSPITAL_COMMUNITY): Payer: Medicare HMO

## 2019-01-26 ENCOUNTER — Ambulatory Visit (HOSPITAL_COMMUNITY): Payer: Medicare HMO | Admitting: Anesthesiology

## 2019-01-26 ENCOUNTER — Other Ambulatory Visit: Payer: Self-pay

## 2019-01-26 ENCOUNTER — Encounter (HOSPITAL_COMMUNITY)
Admission: AD | Disposition: A | Discharge status: Home / Self Care | Payer: Self-pay | Source: Ambulatory Visit | Attending: Neurological Surgery

## 2019-01-26 ENCOUNTER — Encounter (HOSPITAL_COMMUNITY): Payer: Self-pay

## 2019-01-26 DIAGNOSIS — Z419 Encounter for procedure for purposes other than remedying health state, unspecified: Secondary | ICD-10-CM

## 2019-01-26 DIAGNOSIS — K219 Gastro-esophageal reflux disease without esophagitis: Secondary | ICD-10-CM | POA: Diagnosis present

## 2019-01-26 DIAGNOSIS — M4726 Other spondylosis with radiculopathy, lumbar region: Secondary | ICD-10-CM | POA: Diagnosis present

## 2019-01-26 DIAGNOSIS — Z87442 Personal history of urinary calculi: Secondary | ICD-10-CM | POA: Diagnosis not present

## 2019-01-26 DIAGNOSIS — H9190 Unspecified hearing loss, unspecified ear: Secondary | ICD-10-CM | POA: Diagnosis present

## 2019-01-26 DIAGNOSIS — M48062 Spinal stenosis, lumbar region with neurogenic claudication: Secondary | ICD-10-CM | POA: Diagnosis present

## 2019-01-26 DIAGNOSIS — M5416 Radiculopathy, lumbar region: Secondary | ICD-10-CM | POA: Diagnosis present

## 2019-01-26 DIAGNOSIS — I10 Essential (primary) hypertension: Secondary | ICD-10-CM | POA: Diagnosis present

## 2019-01-26 DIAGNOSIS — Z87891 Personal history of nicotine dependence: Secondary | ICD-10-CM | POA: Diagnosis not present

## 2019-01-26 DIAGNOSIS — M960 Pseudarthrosis after fusion or arthrodesis: Principal | ICD-10-CM | POA: Diagnosis present

## 2019-01-26 SURGERY — POSTERIOR LUMBAR FUSION 1 LEVEL
Anesthesia: General | Site: Back

## 2019-01-26 MED ORDER — MIDAZOLAM HCL 2 MG/2ML IJ SOLN
INTRAMUSCULAR | Status: AC
  Filled 2019-01-26: qty 2

## 2019-01-26 MED ORDER — ONDANSETRON HCL 4 MG/2ML IJ SOLN
INTRAMUSCULAR | Status: AC
  Filled 2019-01-26: qty 2

## 2019-01-26 MED ORDER — BUPIVACAINE HCL (PF) 0.5 % IJ SOLN
INTRAMUSCULAR | Status: AC
  Filled 2019-01-26: qty 30

## 2019-01-26 MED ORDER — PROPOFOL 10 MG/ML IV BOLUS
INTRAVENOUS | Status: AC
  Filled 2019-01-26: qty 20

## 2019-01-26 MED ORDER — MEPERIDINE HCL 25 MG/ML IJ SOLN: 6.2500 mg | INTRAMUSCULAR | Status: DC | PRN

## 2019-01-26 MED ORDER — ACETAMINOPHEN 650 MG RE SUPP: 650.0000 mg | RECTAL | Status: DC | PRN

## 2019-01-26 MED ORDER — MORPHINE SULFATE (PF) 2 MG/ML IV SOLN: 2.0000 mg | INTRAVENOUS | Status: DC | PRN

## 2019-01-26 MED ORDER — THROMBIN 5000 UNITS EX SOLR
CUTANEOUS | Status: AC
  Filled 2019-01-26: qty 10000

## 2019-01-26 MED ORDER — PROMETHAZINE HCL 25 MG/ML IJ SOLN: 6.2500 mg | INTRAMUSCULAR | Status: DC | PRN

## 2019-01-26 MED ORDER — SCOPOLAMINE 1 MG/3DAYS TD PT72
1.0000 | MEDICATED_PATCH | TRANSDERMAL | Status: DC
  Administered 2019-01-26: 10:00:00 1.5 mg via TRANSDERMAL

## 2019-01-26 MED ORDER — OXYCODONE-ACETAMINOPHEN 5-325 MG PO TABS
1.0000 | ORAL_TABLET | ORAL | Status: DC | PRN
  Administered 2019-01-26: 2 via ORAL
  Filled 2019-01-26 (×2): qty 2

## 2019-01-26 MED ORDER — ROCURONIUM BROMIDE 10 MG/ML (PF) SYRINGE
PREFILLED_SYRINGE | INTRAVENOUS | Status: AC
  Filled 2019-01-26: qty 40

## 2019-01-26 MED ORDER — ONDANSETRON HCL 4 MG/2ML IJ SOLN: 4.0000 mg | Freq: Four times a day (QID) | INTRAMUSCULAR | Status: DC | PRN

## 2019-01-26 MED ORDER — LOSARTAN POTASSIUM 50 MG PO TABS
50.0000 mg | ORAL_TABLET | Freq: Every day | ORAL | Status: DC
  Administered 2019-01-26 – 2019-01-28 (×3): 50 mg via ORAL
  Filled 2019-01-26 (×3): qty 1

## 2019-01-26 MED ORDER — KETOROLAC TROMETHAMINE 30 MG/ML IJ SOLN
30.0000 mg | Freq: Once | INTRAMUSCULAR | Status: AC | PRN
  Administered 2019-01-26: 17:00:00 30 mg via INTRAVENOUS

## 2019-01-26 MED ORDER — BUPIVACAINE HCL (PF) 0.5 % IJ SOLN
INTRAMUSCULAR | Status: DC | PRN
  Administered 2019-01-26: 5 mL
  Administered 2019-01-26: 25 mL

## 2019-01-26 MED ORDER — SUGAMMADEX SODIUM 200 MG/2ML IV SOLN
INTRAVENOUS | Status: DC | PRN
  Administered 2019-01-26: 300 mg via INTRAVENOUS

## 2019-01-26 MED ORDER — 0.9 % SODIUM CHLORIDE (POUR BTL) OPTIME
TOPICAL | Status: DC | PRN
  Administered 2019-01-26: 1000 mL

## 2019-01-26 MED ORDER — ALBUMIN HUMAN 5 % IV SOLN
INTRAVENOUS | Status: DC | PRN
  Administered 2019-01-26: 15:00:00 via INTRAVENOUS

## 2019-01-26 MED ORDER — PHENYLEPHRINE HCL-NACL 10-0.9 MG/250ML-% IV SOLN
INTRAVENOUS | Status: DC | PRN
  Administered 2019-01-26: 35 ug/min via INTRAVENOUS

## 2019-01-26 MED ORDER — MENTHOL 3 MG MT LOZG: 1.0000 | LOZENGE | OROMUCOSAL | Status: DC | PRN

## 2019-01-26 MED ORDER — KETOROLAC TROMETHAMINE 30 MG/ML IJ SOLN
INTRAMUSCULAR | Status: AC
  Filled 2019-01-26: qty 1

## 2019-01-26 MED ORDER — SODIUM CHLORIDE 0.9 % IV SOLN
INTRAVENOUS | Status: DC | PRN
  Administered 2019-01-26: 13:00:00 500 mL

## 2019-01-26 MED ORDER — THROMBIN 5000 UNITS EX SOLR
OROMUCOSAL | Status: DC | PRN
  Administered 2019-01-26: 5 mL via TOPICAL

## 2019-01-26 MED ORDER — OXYCODONE HCL 5 MG/5ML PO SOLN: 5.0000 mg | Freq: Once | ORAL | Status: DC | PRN

## 2019-01-26 MED ORDER — PROPOFOL 10 MG/ML IV BOLUS
INTRAVENOUS | Status: DC | PRN
  Administered 2019-01-26: 200 mg via INTRAVENOUS

## 2019-01-26 MED ORDER — METHOCARBAMOL 500 MG PO TABS
500.0000 mg | ORAL_TABLET | Freq: Four times a day (QID) | ORAL | Status: DC | PRN
  Administered 2019-01-27 – 2019-01-28 (×2): 500 mg via ORAL
  Filled 2019-01-26 (×2): qty 1

## 2019-01-26 MED ORDER — PHENOL 1.4 % MT LIQD: 1.0000 | OROMUCOSAL | Status: DC | PRN

## 2019-01-26 MED ORDER — HYDROMORPHONE HCL 1 MG/ML IJ SOLN
0.2500 mg | INTRAMUSCULAR | Status: DC | PRN
  Administered 2019-01-26: 17:00:00 0.25 mg via INTRAVENOUS

## 2019-01-26 MED ORDER — MIDAZOLAM HCL 5 MG/5ML IJ SOLN
INTRAMUSCULAR | Status: DC | PRN
  Administered 2019-01-26: 2 mg via INTRAVENOUS

## 2019-01-26 MED ORDER — SCOPOLAMINE 1 MG/3DAYS TD PT72
MEDICATED_PATCH | TRANSDERMAL | Status: AC
  Administered 2019-01-26: 10:00:00 1.5 mg via TRANSDERMAL
  Filled 2019-01-26: qty 1

## 2019-01-26 MED ORDER — FENTANYL CITRATE (PF) 100 MCG/2ML IJ SOLN
INTRAMUSCULAR | Status: DC | PRN
  Administered 2019-01-26 (×3): 50 ug via INTRAVENOUS
  Administered 2019-01-26: 100 ug via INTRAVENOUS

## 2019-01-26 MED ORDER — SODIUM CHLORIDE 0.9% FLUSH: 3.0000 mL | INTRAVENOUS | Status: DC | PRN

## 2019-01-26 MED ORDER — ACETAMINOPHEN 325 MG PO TABS
650.0000 mg | ORAL_TABLET | ORAL | Status: DC | PRN
  Administered 2019-01-27: 650 mg via ORAL
  Filled 2019-01-26: qty 2

## 2019-01-26 MED ORDER — LACTATED RINGERS IV SOLN
INTRAVENOUS | Status: DC
  Administered 2019-01-26 (×3): via INTRAVENOUS

## 2019-01-26 MED ORDER — DEXAMETHASONE SODIUM PHOSPHATE 10 MG/ML IJ SOLN
INTRAMUSCULAR | Status: DC | PRN
  Administered 2019-01-26: 8 mg via INTRAVENOUS

## 2019-01-26 MED ORDER — CHLORHEXIDINE GLUCONATE CLOTH 2 % EX PADS: 6.0000 | MEDICATED_PAD | Freq: Once | CUTANEOUS | Status: DC

## 2019-01-26 MED ORDER — PHENYLEPHRINE HCL (PRESSORS) 10 MG/ML IV SOLN
INTRAVENOUS | Status: DC | PRN
  Administered 2019-01-26: 40 ug via INTRAVENOUS
  Administered 2019-01-26: 80 ug via INTRAVENOUS

## 2019-01-26 MED ORDER — LIDOCAINE 2% (20 MG/ML) 5 ML SYRINGE
INTRAMUSCULAR | Status: DC | PRN
  Administered 2019-01-26: 100 mg via INTRAVENOUS

## 2019-01-26 MED ORDER — POLYETHYLENE GLYCOL 3350 17 G PO PACK: 17.0000 g | PACK | Freq: Every day | ORAL | Status: DC | PRN

## 2019-01-26 MED ORDER — LIDOCAINE-EPINEPHRINE 1 %-1:100000 IJ SOLN
INTRAMUSCULAR | Status: AC
  Filled 2019-01-26: qty 1

## 2019-01-26 MED ORDER — LIDOCAINE-EPINEPHRINE 1 %-1:100000 IJ SOLN
INTRAMUSCULAR | Status: DC | PRN
  Administered 2019-01-26: 5 mL

## 2019-01-26 MED ORDER — METHOCARBAMOL 1000 MG/10ML IJ SOLN
500.0000 mg | Freq: Four times a day (QID) | INTRAVENOUS | Status: DC | PRN
  Filled 2019-01-26: qty 5

## 2019-01-26 MED ORDER — DOCUSATE SODIUM 100 MG PO CAPS
100.0000 mg | ORAL_CAPSULE | Freq: Two times a day (BID) | ORAL | Status: DC
  Administered 2019-01-26 – 2019-01-28 (×4): 100 mg via ORAL
  Filled 2019-01-26 (×4): qty 1

## 2019-01-26 MED ORDER — ACETAMINOPHEN 500 MG PO TABS
ORAL_TABLET | ORAL | Status: AC
  Administered 2019-01-26: 1000 mg via ORAL
  Filled 2019-01-26: qty 2

## 2019-01-26 MED ORDER — ROCURONIUM BROMIDE 50 MG/5ML IV SOSY
PREFILLED_SYRINGE | INTRAVENOUS | Status: DC | PRN
  Administered 2019-01-26: 10 mg via INTRAVENOUS
  Administered 2019-01-26 (×3): 20 mg via INTRAVENOUS
  Administered 2019-01-26: 70 mg via INTRAVENOUS
  Administered 2019-01-26: 10 mg via INTRAVENOUS

## 2019-01-26 MED ORDER — FLEET ENEMA 7-19 GM/118ML RE ENEM: 1.0000 | ENEMA | Freq: Once | RECTAL | Status: DC | PRN

## 2019-01-26 MED ORDER — ACETAMINOPHEN 500 MG PO TABS
1000.0000 mg | ORAL_TABLET | Freq: Once | ORAL | Status: AC
  Administered 2019-01-26: 10:00:00 1000 mg via ORAL

## 2019-01-26 MED ORDER — SODIUM CHLORIDE 0.9% FLUSH
3.0000 mL | Freq: Two times a day (BID) | INTRAVENOUS | Status: DC
  Administered 2019-01-26: 3 mL via INTRAVENOUS

## 2019-01-26 MED ORDER — SENNA 8.6 MG PO TABS
1.0000 | ORAL_TABLET | Freq: Two times a day (BID) | ORAL | Status: DC
  Administered 2019-01-26 – 2019-01-28 (×4): 8.6 mg via ORAL
  Filled 2019-01-26 (×4): qty 1

## 2019-01-26 MED ORDER — ONDANSETRON HCL 4 MG/2ML IJ SOLN
INTRAMUSCULAR | Status: DC | PRN
  Administered 2019-01-26: 4 mg via INTRAVENOUS

## 2019-01-26 MED ORDER — ONDANSETRON HCL 4 MG PO TABS: 4.0000 mg | ORAL_TABLET | Freq: Four times a day (QID) | ORAL | Status: DC | PRN

## 2019-01-26 MED ORDER — LACTATED RINGERS IV SOLN: INTRAVENOUS | Status: DC

## 2019-01-26 MED ORDER — CEFAZOLIN SODIUM-DEXTROSE 2-4 GM/100ML-% IV SOLN
2.0000 g | Freq: Three times a day (TID) | INTRAVENOUS | Status: AC
  Administered 2019-01-26 – 2019-01-27 (×2): 2 g via INTRAVENOUS
  Filled 2019-01-26 (×2): qty 100

## 2019-01-26 MED ORDER — BISACODYL 10 MG RE SUPP: 10.0000 mg | Freq: Every day | RECTAL | Status: DC | PRN

## 2019-01-26 MED ORDER — OXYCODONE HCL 5 MG PO TABS: 5.0000 mg | ORAL_TABLET | Freq: Once | ORAL | Status: DC | PRN

## 2019-01-26 MED ORDER — DEXAMETHASONE SODIUM PHOSPHATE 10 MG/ML IJ SOLN
INTRAMUSCULAR | Status: AC
  Filled 2019-01-26: qty 1

## 2019-01-26 MED ORDER — FENTANYL CITRATE (PF) 250 MCG/5ML IJ SOLN
INTRAMUSCULAR | Status: AC
  Filled 2019-01-26: qty 5

## 2019-01-26 MED ORDER — KETOROLAC TROMETHAMINE 15 MG/ML IJ SOLN
7.5000 mg | Freq: Four times a day (QID) | INTRAMUSCULAR | Status: AC
  Administered 2019-01-26 – 2019-01-27 (×2): 7.5 mg via INTRAVENOUS
  Filled 2019-01-26 (×2): qty 1

## 2019-01-26 MED ORDER — ALUM & MAG HYDROXIDE-SIMETH 200-200-20 MG/5ML PO SUSP: 30.0000 mL | Freq: Four times a day (QID) | ORAL | Status: DC | PRN

## 2019-01-26 MED ORDER — LIDOCAINE 2% (20 MG/ML) 5 ML SYRINGE
INTRAMUSCULAR | Status: AC
  Filled 2019-01-26: qty 5

## 2019-01-26 MED ORDER — HYDROMORPHONE HCL 1 MG/ML IJ SOLN
INTRAMUSCULAR | Status: AC
  Filled 2019-01-26: qty 1

## 2019-01-26 MED ORDER — PHENYLEPHRINE 40 MCG/ML (10ML) SYRINGE FOR IV PUSH (FOR BLOOD PRESSURE SUPPORT)
PREFILLED_SYRINGE | INTRAVENOUS | Status: AC
  Filled 2019-01-26: qty 10

## 2019-01-26 MED ORDER — ARTIFICIAL TEARS OPHTHALMIC OINT
TOPICAL_OINTMENT | OPHTHALMIC | Status: AC
  Filled 2019-01-26: qty 3.5

## 2019-01-26 SURGICAL SUPPLY — 68 items
BAG DECANTER FOR FLEXI CONT (MISCELLANEOUS) ×3 IMPLANT
BASKET BONE COLLECTION (BASKET) ×3 IMPLANT
BLADE CLIPPER SURG (BLADE) ×3 IMPLANT
BONE CANC CHIPS 40CC CAN1/2 (Bone Implant) ×3 IMPLANT
BUR MATCHSTICK NEURO 3.0 LAGG (BURR) ×3 IMPLANT
CAGE PLIF 8X9X23-12 LUMBAR (Cage) ×6 IMPLANT
CANISTER SUCT 3000ML PPV (MISCELLANEOUS) ×3 IMPLANT
CHIPS CANC BONE 40CC CAN1/2 (Bone Implant) ×1 IMPLANT
CONT SPEC 4OZ CLIKSEAL STRL BL (MISCELLANEOUS) ×3 IMPLANT
COVER BACK TABLE 60X90IN (DRAPES) ×3 IMPLANT
COVER WAND RF STERILE (DRAPES) IMPLANT
DECANTER SPIKE VIAL GLASS SM (MISCELLANEOUS) ×3 IMPLANT
DERMABOND ADVANCED (GAUZE/BANDAGES/DRESSINGS) ×2
DERMABOND ADVANCED .7 DNX12 (GAUZE/BANDAGES/DRESSINGS) ×1 IMPLANT
DEVICE DISSECT PLASMABLAD 3.0S (MISCELLANEOUS) ×1 IMPLANT
DRAPE C-ARM 42X72 X-RAY (DRAPES) ×6 IMPLANT
DRAPE C-ARMOR (DRAPES) ×3 IMPLANT
DRAPE HALF SHEET 40X57 (DRAPES) ×3 IMPLANT
DRAPE LAPAROTOMY 100X72X124 (DRAPES) ×3 IMPLANT
DRSG OPSITE POSTOP 4X8 (GAUZE/BANDAGES/DRESSINGS) ×3 IMPLANT
DURAPREP 26ML APPLICATOR (WOUND CARE) ×3 IMPLANT
DURASEAL APPLICATOR TIP (TIP) IMPLANT
DURASEAL SPINE SEALANT 3ML (MISCELLANEOUS) IMPLANT
ELECT REM PT RETURN 9FT ADLT (ELECTROSURGICAL) ×3
ELECTRODE REM PT RTRN 9FT ADLT (ELECTROSURGICAL) ×1 IMPLANT
GAUZE 4X4 16PLY RFD (DISPOSABLE) IMPLANT
GAUZE SPONGE 4X4 12PLY STRL (GAUZE/BANDAGES/DRESSINGS) IMPLANT
GLOVE BIO SURGEON STRL SZ 6.5 (GLOVE) ×4 IMPLANT
GLOVE BIO SURGEONS STRL SZ 6.5 (GLOVE) ×2
GLOVE BIOGEL PI IND STRL 6.5 (GLOVE) ×3 IMPLANT
GLOVE BIOGEL PI IND STRL 8.5 (GLOVE) ×2 IMPLANT
GLOVE BIOGEL PI INDICATOR 6.5 (GLOVE) ×6
GLOVE BIOGEL PI INDICATOR 8.5 (GLOVE) ×4
GLOVE ECLIPSE 8.5 STRL (GLOVE) ×6 IMPLANT
GLOVE SURG SS PI 6.0 STRL IVOR (GLOVE) ×9 IMPLANT
GLOVE SURG SS PI 7.5 STRL IVOR (GLOVE) ×18 IMPLANT
GOWN STRL REUS W/ TWL LRG LVL3 (GOWN DISPOSABLE) ×5 IMPLANT
GOWN STRL REUS W/ TWL XL LVL3 (GOWN DISPOSABLE) ×1 IMPLANT
GOWN STRL REUS W/TWL 2XL LVL3 (GOWN DISPOSABLE) ×6 IMPLANT
GOWN STRL REUS W/TWL LRG LVL3 (GOWN DISPOSABLE) ×10
GOWN STRL REUS W/TWL XL LVL3 (GOWN DISPOSABLE) ×2
HEMOSTAT POWDER KIT SURGIFOAM (HEMOSTASIS) ×3 IMPLANT
KIT BASIN OR (CUSTOM PROCEDURE TRAY) ×3 IMPLANT
KIT INFUSE SMALL (Orthopedic Implant) ×3 IMPLANT
KIT TURNOVER KIT B (KITS) ×3 IMPLANT
MILL MEDIUM DISP (BLADE) ×3 IMPLANT
NEEDLE HYPO 22GX1.5 SAFETY (NEEDLE) ×3 IMPLANT
NS IRRIG 1000ML POUR BTL (IV SOLUTION) ×3 IMPLANT
PACK LAMINECTOMY NEURO (CUSTOM PROCEDURE TRAY) ×3 IMPLANT
PAD ARMBOARD 7.5X6 YLW CONV (MISCELLANEOUS) IMPLANT
PATTIES SURGICAL .5 X1 (DISPOSABLE) ×3 IMPLANT
PLASMABLADE 3.0S (MISCELLANEOUS) ×3
ROD RELIN-O LORD 5.5X65MM (Rod) ×6 IMPLANT
SCREW LOCK RELINE 5.5 TULIP (Screw) ×18 IMPLANT
SCREW RELINE-O POLY 7.5X50 (Screw) ×4 IMPLANT
SCREW RLINE PLY 2S 50X7.5XPA (Screw) ×2 IMPLANT
SPONGE LAP 4X18 RFD (DISPOSABLE) IMPLANT
SPONGE SURGIFOAM ABS GEL 100 (HEMOSTASIS) IMPLANT
SUT PROLENE 6 0 BV (SUTURE) IMPLANT
SUT VIC AB 1 CT1 18XBRD ANBCTR (SUTURE) ×1 IMPLANT
SUT VIC AB 1 CT1 8-18 (SUTURE) ×2
SUT VIC AB 2-0 CP2 18 (SUTURE) ×3 IMPLANT
SUT VIC AB 3-0 SH 8-18 (SUTURE) ×9 IMPLANT
SYR 3ML LL SCALE MARK (SYRINGE) ×12 IMPLANT
TOWEL GREEN STERILE (TOWEL DISPOSABLE) ×3 IMPLANT
TOWEL GREEN STERILE FF (TOWEL DISPOSABLE) ×3 IMPLANT
TRAY FOLEY MTR SLVR 16FR STAT (SET/KITS/TRAYS/PACK) ×3 IMPLANT
WATER STERILE IRR 1000ML POUR (IV SOLUTION) ×3 IMPLANT

## 2019-01-26 NOTE — Op Note (Signed)
Date of surgery: 01/26/2019 Preoperative diagnosis: Chronic lumbar radiculopathy L5 history of laminectomy and decompression L5-S1 history of fusion L4-L5, pseudoarthrosis L4-L5 Postoperative diagnosis: Same Procedure: Revision of pseudoarthrosis L4-L5 with supplemental posterior arthrodesis and decompression of L5-S1 with posterior lumbar interbody arthrodesis using allograft autograft and infuse and 8 mm x 9 mm x 23 mm peek cages with 12 degrees lordosis revision of pedicle screw fixation from L4 to the sacrum posterior lateral arthrodesis L4 S1. Surgeon: Barnett Abu First Assistant: Lelon Perla, MD Anesthesia: General endotracheal Indications: Matthew Kirk is a 68 year old individual who a number of months ago underwent surgical decompression at L5-S1 secondary to lateral recess stenosis he initially had good relief but subsequently had persistent pain in his back and his legs and ultimately was felt that because of advanced degenerative changes at L5-S1 and a pseudoarthrosis that was noted at L4-L5 he required surgical decompression and stabilization from L4 to the sacrum with revision of his pseudoarthrosis at L4-5.  He is admitted to the operating room for that now.  Procedure: Patient was brought to the operating room supine on the stretcher.  After the smooth induction of general endotracheal anesthesia he was carefully turned prone the back was prepped with alcohol DuraPrep and draped in a sterile fashion.  Midline incision was created through his previous scar and carried down to the lumbodorsal fascia which was opened on either side of midline to expose the inferior margin of the hardware at L4-5.  Then the dissection was taken out laterally to expose all the hardware and gradually the The Screws Were Removed and the Rods Were Removed and the Screws Themselves Were Noted Individually to Be in the Bone and Not Particularly Loose but There Placement in the Bone Was Rather Shallow by Removing the  Screws and Drilling down on the Facet Capsules Screws Could Be Placed into the Bone Even Further with Good Purchase Ventrally.  This Was Done on the Left Side and on the Right Side and the Lateral Gutters and the Posterior Masses Were the Corticated with the Lamina of L4 and L5 Being Decorticated and the Facet Capsules Being Decorticated.  Should Be Noted That There Was Not Much Motion between L4 and L5 That Could Be Induced with These Maneuvers.  Once This Area Was Prepared for Grafting Attention Was Turned to L5-S1 Were Bilateral Laminotomies Were Created out to and Including the Entirety of the Facet at L5-S1.  The Disc Space Was Isolated and a Discectomy Was Performed Bilaterally Removing a Substantial Quantity of Severely Degenerated Desiccated Disc Material at L5-S1.  The Endplates Were Curettaged so As to Remove All the Articular Cartilage in the Interspace.  Once This Was Achieved the Interspace Was Sized for an Appropriate Size Spacer and Was Felt That an 8 x 9 x 23 Mm Spacer Measuring 12 Degrees and Lordosis Would Fit Best into This Interval a Total of 16 Cc of Allograft Autograft and Infuse Was Placed into the L5-S1 Interspace along with the 2 Spacers.  The Lateral Gutters Which Had Been Decorticated Were Then Packed with an Additional 6 Cc of Bone Graft on Each Side and an Additional 6 Cc Was Placed into the L4-L5 Interspace in the Lateral Gutters.  Then Using Fluoroscopic Guidance 7.5 x 50 Mm Screws Were Placed into the Sacrum.  The Screws Were Connected Together Using Precontoured 5.5 x 65 Mm Rods and This Was Set in a Neutral Construct.  The System Was Tightened down and the Bone Graft Was Packed into the  Lateral Gutters from L4 to the Sacrum Once This Was Completed Final Radiographs Were Obtained in AP and Lateral Projection Demonstrating a Neutral Construct with Good Lordosis Being Maintained at the Lumbosacral Junction.  With This the Area Was Carefully Irrigated with Copious Amounts of Saline  Hemostasis in the Soft Tissues Was Doubly Checked and Then the Lumbodorsal Fascia Was Reapproximated with #1 Vicryl in Interrupted Fashion 2-0 Vicryl Was Used in the Subcutaneous Tissues and 3-0 Vicryl Subcuticularly Blood Loss for the Procedure Was Noted to Be 400 Cc 190 cc of Cell Saver blood was returned to the patient.  Patient was then transferred to the recovery room stable condition.

## 2019-01-26 NOTE — Transfer of Care (Signed)
Immediate Anesthesia Transfer of Care Note  Patient: Matthew Kirk.  Procedure(s) Performed: Lumbar five Sacral one Posterior lumbar interbody fusion with Lumbar four-five Lumbar five Sacral one Posterior lateral arthrodesis, pedicle screw fixation Lumbar four to Sacral one (N/A Back)  Patient Location: PACU  Anesthesia Type:General  Level of Consciousness: awake and drowsy  Airway & Oxygen Therapy: Patient Spontanous Breathing and Patient connected to face mask oxygen  Post-op Assessment: Report given to RN and Post -op Vital signs reviewed and stable  Post vital signs: Reviewed and stable  Last Vitals:  Vitals Value Taken Time  BP 126/82 01/26/19 1632  Temp    Pulse 105 01/26/19 1633  Resp 17 01/26/19 1633  SpO2 97 % 01/26/19 1633  Vitals shown include unvalidated device data.  Last Pain:  Vitals:   01/26/19 1014  TempSrc:   PainSc: 8          Complications: No apparent anesthesia complications

## 2019-01-26 NOTE — Progress Notes (Signed)
Orthopedic Tech Progress Note Patient Details:  Matthew Kirk 01-29-1951 [id number] Patient has CLAMSHELL brace,i Fitted patient with a LSO/QUIK DRAW brace. Showed him how to apply as well as remove it..Removed and put brace in chair.  Patient ID: Matthew Antigua., male   DOB: October 23, 1950, 68 y.o.   MRN: [id number]   Matthew Kirk 01/26/2019, 6:26 PM

## 2019-01-26 NOTE — H&P (Signed)
Matthew Kirk. is an 68 y.o. male.   Chief Complaint: Back and lower extremity pain, pseudoarthrosis L4-5 HPI: Mr. Orva Gwaltney is a 68 year old individual who has had a recent decompression at L5-S1 secondary to lateral recess stenosis.  He has had persistent pain in the buttock and right lower extremity in addition to pain across the low back.  He has evidence of a pseudoarthrosis at L4-L5 which had an instrumented fusion a number of years ago.  He also has advanced degenerative changes at L5-S1 is been advised that he needs to undergo surgical decompression and stabilization at L5-S1 in addition to revision of the pseudoarthrosis at L4-L5.  He is now admitted for this procedure.  Past Medical History:  Diagnosis Date  . GERD (gastroesophageal reflux disease)    takes otc prilosec - takes 3 x a week preventative  . H/O seasonal allergies   . History of kidney stones    last flare up 10-12 yrs ago.  Marland Kitchen HOH (hard of hearing)    loss of hearing over time  . Hypertension   . Lumbar radiculopathy, chronic   . PONV (postoperative nausea and vomiting)     Past Surgical History:  Procedure Laterality Date  . BACK SURGERY     has had 2 on his lower back.  . LUMBAR LAMINECTOMY/DECOMPRESSION MICRODISCECTOMY Bilateral 06/29/2018   Procedure: Bilateral Lumbar five Sacral One Laminectomy/Foraminotomy;  Surgeon: Kristeen Miss, MD;  Location: [facility];  Service: Neurosurgery;  Laterality: Bilateral;  . TONSILLECTOMY      History reviewed. No pertinent family history. Social History:  reports that he quit smoking about 20 years ago. He quit smokeless tobacco use about 20 years ago. He reports that he does not drink alcohol or use drugs.  Allergies: No Known Allergies  Medications Prior to Admission  Medication Sig Dispense Refill  . acetaminophen (TYLENOL) 500 MG tablet Take 1,000 mg by mouth every 6 (six) hours as needed for mild pain.    . Ascorbic Acid (VITAMIN C) 1000 MG tablet Take 1,000  mg by mouth daily.    . Cholecalciferol (VITAMIN D3) 50 MCG (2000 UT) TABS Take 6,000 Units by mouth daily.    . cyanocobalamin (,VITAMIN B-12,) 1000 MCG/ML injection Inject 1,000 mcg into the muscle every 30 (thirty) days.    Marland Kitchen losartan (COZAAR) 25 MG tablet Take 50 mg by mouth daily.    . Multiple Vitamins-Minerals (CENTRUM SILVER 50+MEN) TABS Take 1 tablet by mouth at bedtime.    . Omega-3 Fatty Acids (FISH OIL TRIPLE STRENGTH) 1400 MG CAPS Take 1,400 mg by mouth daily.    Marland Kitchen VITAMIN A PO Take 1 capsule by mouth daily.      No results found for this or any previous visit (from the past 48 hour(s)). No results found.  Review of Systems  Constitutional: Negative.   HENT: Negative.   Respiratory: Negative.   Cardiovascular: Negative.   Gastrointestinal: Negative.   Genitourinary: Negative.   Musculoskeletal: Positive for back pain.  Skin: Negative.   Neurological: Positive for sensory change, speech change, focal weakness and weakness.  Endo/Heme/Allergies: Negative.     Blood pressure (!) 163/94, pulse 91, temperature 97.9 F (36.6 C), temperature source Oral, resp. rate 18, height 6\' 3"  (1.905 m), weight 124.7 kg, SpO2 96 %. Physical Exam  Constitutional: He is oriented to person, place, and time. He appears well-developed and well-nourished.  HENT:  Head: Normocephalic and atraumatic.  Eyes: Pupils are equal, round, and reactive to light.  Conjunctivae and EOM are normal.  Neck: Normal range of motion. Neck supple.  Cardiovascular: Normal rate.  Respiratory: Effort normal and breath sounds normal.  GI: Soft.  Musculoskeletal:     Comments: Positive straight leg raising at 30 degrees in either lower extremity Patrick's maneuver is negative bilaterally.  Mild weakness is noted in tibialis anterior on the right at 4 out of 5.  Neurological: He is alert and oriented to person, place, and time. He has normal reflexes.  Skin: Skin is warm and dry.  Psychiatric: He has a normal mood  and affect. His behavior is normal. Judgment and thought content normal.     Assessment/Plan Pseudoarthrosis L4-5, lumbar spondylosis with radiculopathy L5-S1.  Plan: Decompression and fusion L5-S1 with revision of pseudoarthrosis L4-L5.  Posterior stabilization L4 to sacrum.  Stefani Dama, MD 01/26/2019, 11:06 AM

## 2019-01-26 NOTE — Anesthesia Procedure Notes (Signed)
Procedure Name: Intubation Date/Time: 01/26/2019 11:52 AM Performed by: Inda Coke, CRNA Pre-anesthesia Checklist: Patient identified, Emergency Drugs available, Suction available and Patient being monitored Patient Re-evaluated:Patient Re-evaluated prior to induction Oxygen Delivery Method: Circle System Utilized Preoxygenation: Pre-oxygenation with 100% oxygen Induction Type: IV induction Ventilation: Mask ventilation without difficulty and Oral airway inserted - appropriate to patient size Laryngoscope Size: Mac and 4 Grade View: Grade I Tube type: Oral Tube size: 7.5 mm Number of attempts: 1 Airway Equipment and Method: Stylet and Oral airway Placement Confirmation: ETT inserted through vocal cords under direct vision,  positive ETCO2 and breath sounds checked- equal and bilateral Secured at: 22 cm Tube secured with: Tape Dental Injury: Teeth and Oropharynx as per pre-operative assessment

## 2019-01-26 NOTE — Anesthesia Postprocedure Evaluation (Signed)
Anesthesia Post Note  Patient: Matthew Kirk.  Procedure(s) Performed: Lumbar five Sacral one Posterior lumbar interbody fusion with Lumbar four-five Lumbar five Sacral one Posterior lateral arthrodesis, pedicle screw fixation Lumbar four to Sacral one (N/A Back)     Patient location during evaluation: PACU Anesthesia Type: General Level of consciousness: awake and alert, oriented and patient cooperative Pain management: pain level controlled Vital Signs Assessment: post-procedure vital signs reviewed and stable Respiratory status: spontaneous breathing, nonlabored ventilation and respiratory function stable Cardiovascular status: blood pressure returned to baseline and stable Postop Assessment: no apparent nausea or vomiting Anesthetic complications: no    Last Vitals:  Vitals:   01/26/19 1700 01/26/19 1715  BP: 104/74 123/78  Pulse: 98 (!) 103  Resp: 12 12  Temp:    SpO2: 92% 95%    Last Pain:  Vitals:   01/26/19 1717  TempSrc:   PainSc: 8     LLE Motor Response: Purposeful movement;Responds to commands (01/26/19 1715) LLE Sensation: Full sensation;Numbness;Tingling(chronic- feet) (01/26/19 1715) RLE Motor Response: Responds to commands;Purposeful movement (01/26/19 1715) RLE Sensation: Numbness;Tingling(chronic- feet ) (01/26/19 1715)      Pervis Hocking

## 2019-01-27 LAB — CBC
HCT: 35.2 % — ABNORMAL LOW (ref 39.0–52.0)
Hemoglobin: 12 g/dL — ABNORMAL LOW (ref 13.0–17.0)
MCH: 32.4 pg (ref 26.0–34.0)
MCHC: 34.1 g/dL (ref 30.0–36.0)
MCV: 95.1 fL (ref 80.0–100.0)
Platelets: 190 10*3/uL (ref 150–400)
RBC: 3.7 MIL/uL — ABNORMAL LOW (ref 4.22–5.81)
RDW: 12.5 % (ref 11.5–15.5)
WBC: 15.1 10*3/uL — ABNORMAL HIGH (ref 4.0–10.5)
nRBC: 0 % (ref 0.0–0.2)

## 2019-01-27 LAB — BASIC METABOLIC PANEL
Anion gap: 9 (ref 5–15)
BUN: 24 mg/dL — ABNORMAL HIGH (ref 8–23)
CO2: 23 mmol/L (ref 22–32)
Calcium: 8.2 mg/dL — ABNORMAL LOW (ref 8.9–10.3)
Chloride: 105 mmol/L (ref 98–111)
Creatinine, Ser: 1.47 mg/dL — ABNORMAL HIGH (ref 0.61–1.24)
GFR calc Af Amer: 57 mL/min — ABNORMAL LOW (ref >60)
GFR calc non Af Amer: 49 mL/min — ABNORMAL LOW (ref >60)
Glucose, Bld: 151 mg/dL — ABNORMAL HIGH (ref 70–99)
Potassium: 4.3 mmol/L (ref 3.5–5.1)
Sodium: 137 mmol/L (ref 135–145)

## 2019-01-27 MED ORDER — HYDROCODONE-ACETAMINOPHEN 5-325 MG PO TABS
1.0000 | ORAL_TABLET | ORAL | Status: DC | PRN
  Administered 2019-01-27 – 2019-01-28 (×6): 2 via ORAL
  Filled 2019-01-27 (×6): qty 2

## 2019-01-27 NOTE — Evaluation (Signed)
Physical Therapy Evaluation Patient Details Name: Matthew Kirk. MRN: [id number] DOB: 29-Aug-1950 Today's Date: 01/27/2019   History of Present Illness  Pt is 68 yo male s/p revision of L4-5 and decompression of L5-S1. PMH including prior lumbar laminectomy/decompression,  HTN, and HOH.  Clinical Impression  Pt admitted with above diagnosis. At the time of PT eval, pt was able to demonstrate transfers and ambulation with gross min guard assist to supervision for safety. Pt reports he plans to d/c tomorrow after discussing with MD. Pt was educated on precautions, brace application/wearing schedule, appropriate activity progression, and car transfer. Pt currently with functional limitations due to the deficits listed below (see PT Problem List). Pt will benefit from skilled PT to increase their independence and safety with mobility to allow discharge to the venue listed below.    Follow Up Recommendations No PT follow up;Supervision for mobility/OOB    Equipment Recommendations  None recommended by PT    Recommendations for Other Services       Precautions / Restrictions Precautions Precautions: Back Precaution Booklet Issued: Yes (comment) Precaution Comments: recalled 2/3 precautions, reviewed all back precautions Required Braces or Orthoses: Spinal Brace Spinal Brace: Lumbar corset;Applied in sitting position Restrictions Weight Bearing Restrictions: No      Mobility  Bed Mobility Overal bed mobility: Needs Assistance Bed Mobility: Rolling;Sidelying to Sit Rolling: Supervision Sidelying to sit: Supervision       General bed mobility comments: Heavy use of railing for support as pt rolled to L to exit bed. Increased time and effort to transition from sidelying into full sitting position at EOB.   Transfers Overall transfer level: Needs assistance Equipment used: None Transfers: Sit to/from Stand Sit to Stand: Min guard         General transfer comment: Close  guard for safety as pt powered up to full stand. From low bed height, pt with wide BOS and pushing off thighs to achieve full stand.   Ambulation/Gait Ambulation/Gait assistance: Supervision Gait Distance (Feet): 400 Feet Assistive device: None Gait Pattern/deviations: Step-through pattern;Decreased stride length;Trunk flexed Gait velocity: Decreased Gait velocity interpretation: <1.8 ft/sec, indicate of risk for recurrent falls General Gait Details: VC's for improved posture and general safety with ambulation in hall. Pt with improving balance as distance progressed.   Stairs         General stair comments: Pt declined stair training, stating he feels comfortable entering his home.   Wheelchair Mobility    Modified Rankin (Stroke Patients Only)       Balance Overall balance assessment: Mild deficits observed, not formally tested                                           Pertinent Vitals/Pain Pain Assessment: Faces Faces Pain Scale: Hurts little more Pain Location: back Pain Descriptors / Indicators: Aching;Discomfort;Grimacing;Sore Pain Intervention(s): Limited activity within patient's tolerance;Monitored during session;Repositioned    Home Living Family/patient expects to be discharged to:: Private residence Living Arrangements: Spouse/significant other Available Help at Discharge: Family;Available 24 hours/day Type of Home: House Home Access: Stairs to enter Entrance Stairs-Rails: None Entrance Stairs-Number of Steps: 2 Home Layout: One level Home Equipment: Cane - single point;Adaptive equipment Additional Comments: wife (A) with LB showering    Prior Function Level of Independence: Independent         Comments: Reports using cane for ambulation  Hand Dominance   Dominant Hand: Left    Extremity/Trunk Assessment   Upper Extremity Assessment Upper Extremity Assessment: Defer to OT evaluation    Lower Extremity  Assessment Lower Extremity Assessment: Generalized weakness(Consistent with acute pain and pre-op diagnosis)    Cervical / Trunk Assessment Cervical / Trunk Assessment: (s/p surgery)  Communication   Communication: No difficulties  Cognition Arousal/Alertness: Awake/alert Behavior During Therapy: WFL for tasks assessed/performed Overall Cognitive Status: Within Functional Limits for tasks assessed                                        General Comments General comments (skin integrity, edema, etc.): Pt reporting that his legs feel weaker than normal    Exercises     Assessment/Plan    PT Assessment Patient needs continued PT services  PT Problem List Decreased strength;Decreased activity tolerance;Decreased balance;Decreased mobility;Decreased knowledge of use of DME;Decreased safety awareness;Decreased knowledge of precautions;Pain       PT Treatment Interventions DME instruction;Gait training;Stair training;Functional mobility training;Therapeutic activities;Therapeutic exercise;Neuromuscular re-education;Patient/family education    PT Goals (Current goals can be found in the Care Plan section)  Acute Rehab PT Goals Patient Stated Goal: go home PT Goal Formulation: With patient Time For Goal Achievement: 02/03/19 Potential to Achieve Goals: Good    Frequency Min 5X/week   Barriers to discharge        Co-evaluation               AM-PAC PT "6 Clicks" Mobility  Outcome Measure Help needed turning from your back to your side while in a flat bed without using bedrails?: None Help needed moving from lying on your back to sitting on the side of a flat bed without using bedrails?: None Help needed moving to and from a bed to a chair (including a wheelchair)?: None Help needed standing up from a chair using your arms (e.g., wheelchair or bedside chair)?: None Help needed to walk in hospital room?: A Little Help needed climbing 3-5 steps with a  railing? : A Little 6 Click Score: 22    End of Session Equipment Utilized During Treatment: Gait belt;Back brace Activity Tolerance: Patient tolerated treatment well Patient left: with call bell/phone within reach(Sitting EOB) Nurse Communication: Mobility status PT Visit Diagnosis: Unsteadiness on feet (R26.81);Pain Pain - part of body: (back)    Time: 1941-7408 PT Time Calculation (min) (ACUTE ONLY): 11 min   Charges:   PT Evaluation $PT Eval Moderate Complexity: 1 Mod          Conni Slipper, PT, DPT Acute Rehabilitation Services Pager: [phone] Office: [phone]   Marylynn Pearson 01/27/2019, 11:31 AM

## 2019-01-27 NOTE — Progress Notes (Signed)
Patient ID: Matthew Kirk., male   DOB: 09-24-1950, 68 y.o.   MRN: [id number] Vital signs are stable Motor function is intact Incision is clean and dry Ambulating slowly Continue to monitor today Consider discharge for tomorrow

## 2019-01-27 NOTE — Progress Notes (Signed)
Occupational Therapy Evaluation Patient Details Name: Matthew Kirk. MRN: [id number] DOB: [birth year] Today's Date: 01/27/2019    History of Present Illness Pt is 68 yo male s/p revision of L4-5 and decompression of L5-S1. PMH including prior lumbar laminectomy/decompression,  HTN, and HOH.   Clinical Impression   PTA, pt lived in one story home with his wife and reports independence for all ADLs and IADLs. Pt currently presents with decreased strength, balance, activity tolerance, knowledge of precautions, and increased pain. Provided education regarding brace management and compensatory strategies for ADLs. Pt required supervision for oral care and hand hygiene, min guard A for LB ADLs and functional transfers, and min A and VCs for brace management. Recommend pt dc home with no OT follow up once medically stable per MD. All education and acute needs met, will sign off.     Follow Up Recommendations  No OT follow up;Supervision/Assistance - 24 hour    Equipment Recommendations  None recommended by OT    Recommendations for Other Services PT consult     Precautions / Restrictions Precautions Precautions: Back Precaution Booklet Issued: Yes (comment) Precaution Comments: recalled 2/3 precautions, reviewed all back precautions Required Braces or Orthoses: Spinal Brace Spinal Brace: Lumbar corset;Applied in sitting position Restrictions Weight Bearing Restrictions: No      Mobility Bed Mobility               General bed mobility comments: pt in bathroom with nurse tech upon arrival  Transfers Overall transfer level: Needs assistance Equipment used: None Transfers: Sit to/from Stand Sit to Stand: Min guard         General transfer comment: pt required min guard A for balance and safety    Balance Overall balance assessment: Mild deficits observed, not formally tested                                         ADL either performed or  assessed with clinical judgement   ADL Overall ADL's : Needs assistance/impaired Eating/Feeding: Independent;Sitting   Grooming: Oral care;Wash/dry hands;Brushing hair;Supervision/safety;Standing Grooming Details (indicate cue type and reason): provided education regarding compensatory technique for oral care. required supervision and VCs to using compensatory strategies Upper Body Bathing: Supervision/ safety;Sitting   Lower Body Bathing: Sit to/from stand;Min guard   Upper Body Dressing : Supervision/safety;Sitting;Cueing for sequencing;Minimal assistance Upper Body Dressing Details (indicate cue type and reason): Pt required supervision for donning shirt. Provided education regarding brace management. Pt required min A and VCs to orient brace and don correctly Lower Body Dressing: Min guard;Sit to/from stand Lower Body Dressing Details (indicate cue type and reason): provided education on compensatory strategies for LB ADLs. Pt donned LB clothing with min guard A for safety Toilet Transfer: Min guard;Ambulation;Regular Museum/gallery exhibitions officer and Hygiene: Min guard;Sit to/from stand       Functional mobility during ADLs: Min guard General ADL Comments: Pt required supervision for UB ADLs, min A for brace management, and min guard A for LB ADLs and functional transfers     Vision Baseline Vision/History: Wears glasses Wears Glasses: Reading only Patient Visual Report: No change from baseline       Perception     Praxis      Pertinent Vitals/Pain Pain Assessment: Faces Faces Pain Scale: Hurts little more Pain Location: back Pain Descriptors / Indicators: Aching;Discomfort;Grimacing;Sore Pain Intervention(s): Monitored during session;Repositioned  Hand Dominance Left   Extremity/Trunk Assessment Upper Extremity Assessment Upper Extremity Assessment: Overall WFL for tasks assessed   Lower Extremity Assessment Lower Extremity Assessment: Defer to  PT evaluation   Cervical / Trunk Assessment Cervical / Trunk Assessment: Normal   Communication Communication Communication: No difficulties   Cognition Arousal/Alertness: Awake/alert Behavior During Therapy: WFL for tasks assessed/performed Overall Cognitive Status: Within Functional Limits for tasks assessed                                     General Comments  Pt reporting that his legs feel weaker than normal    Exercises     Shoulder Instructions      Home Living Family/patient expects to be discharged to:: Private residence Living Arrangements: Spouse/significant other Available Help at Discharge: Family;Available 24 hours/day Type of Home: House Home Access: Stairs to enter [employer] of Steps: 2 Entrance Stairs-Rails: None Home Layout: One level     Bathroom Shower/Tub: Occupational psychologist: Handicapped height     Home Equipment: Cane - single point;Adaptive equipment Adaptive Equipment: Reacher;Long-handled shoe horn        Prior Functioning/Environment Level of Independence: Independent                 OT Problem List: Decreased strength;Decreased activity tolerance;Impaired balance (sitting and/or standing);Decreased safety awareness;Decreased knowledge of use of DME or AE;Decreased knowledge of precautions;Pain      OT Treatment/Interventions:      OT Goals(Current goals can be found in the care plan section) Acute Rehab OT Goals Patient Stated Goal: go home OT Goal Formulation: All assessment and education complete, DC therapy  OT Frequency:     Barriers to D/C:            Co-evaluation              AM-PAC OT "6 Clicks" Daily Activity     Outcome Measure Help from another person eating meals?: None Help from another person taking care of personal grooming?: None Help from another person toileting, which includes using toliet, bedpan, or urinal?: A Little Help from another person bathing  (including washing, rinsing, drying)?: A Little Help from another person to put on and taking off regular upper body clothing?: A Little Help from another person to put on and taking off regular lower body clothing?: A Little 6 Click Score: 20   End of Session Equipment Utilized During Treatment: Back brace Nurse Communication: Mobility status  Activity Tolerance: Patient tolerated treatment well Patient left: in bed;with call bell/phone within reach  OT Visit Diagnosis: Unsteadiness on feet (R26.81);Muscle weakness (generalized) (M62.81);Pain Pain - part of body: (back)                Time: 5009-3818 OT Time Calculation (min): 21 min Charges:  OT General Charges $OT Visit: 1 Visit OT Evaluation $OT Eval Low Complexity: [place], OT Student  Gus Rankin 01/27/2019, 8:17 AM

## 2019-01-28 MED ORDER — METHOCARBAMOL 500 MG PO TABS: 500.0000 mg | ORAL_TABLET | Freq: Four times a day (QID) | ORAL | 3 refills | Status: AC | PRN

## 2019-01-28 MED ORDER — HYDROCODONE-ACETAMINOPHEN 5-325 MG PO TABS: 1.0000 | ORAL_TABLET | ORAL | 0 refills | Status: AC | PRN

## 2019-01-28 NOTE — Discharge Summary (Signed)
Physician Discharge Summary  Patient ID: Matthew Kirk. MRN: [id number] DOB/AGE: 67-Jul-1952 67 y.o.  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  Consults: None  Significant Diagnostic Studies: None  Treatments: surgery: Decompression and fusion L4 to sacrum with posterior lumbar interbody arthrodesis L5-S1 posterior lateral arthrodesis L5-S1  Discharge Exam: Blood pressure (!) 136/91, pulse 93, temperature 98.5 F (36.9 C), temperature source Oral, resp. rate 16, height 6\' 3"  (1.905 m), weight 124.7 kg, SpO2 96 %. Incision is clean and dry.  Station and gait are intact  Disposition: Discharge disposition: 01-Home or Self Care       Discharge Instructions    Call MD for:  redness, tenderness, or signs of infection (pain, swelling, redness, odor or green/yellow discharge around incision site)   Complete by: As directed    Call MD for:  severe uncontrolled pain   Complete by: As directed    Call MD for:  temperature >100.4   Complete by: As directed    Diet - low sodium heart healthy   Complete by: As directed    Incentive spirometry RT   Complete by: As directed    Increase activity slowly   Complete by: As directed      Allergies as of 01/28/2019   No Known Allergies     Medication List    TAKE these medications   acetaminophen 500 MG tablet Commonly known as: TYLENOL Take 1,000 mg by mouth every 6 (six) hours as needed for mild pain.   Centrum Silver 50+Men Tabs Take 1  tablet by mouth at bedtime.   cyanocobalamin 1000 MCG/ML injection Commonly known as: (VITAMIN B-12) Inject 1,000 mcg into the muscle every 30 (thirty) days.   Fish Oil Triple Strength 1400 MG Caps Take 1,400 mg by mouth daily.   HYDROcodone-acetaminophen 5-325 MG tablet Commonly known as: NORCO/VICODIN Take 1-2 tablets by mouth every 4 (four) hours as needed for moderate pain.   losartan 25 MG tablet Commonly known as: COZAAR Take 50 mg by mouth daily.   methocarbamol 500 MG tablet Commonly known as: ROBAXIN Take 1 tablet (500 mg total) by mouth every 6 (six) hours as needed for muscle spasms.   VITAMIN A PO Take 1 capsule by mouth daily.   vitamin C 1000 MG tablet Take 1,000 mg by mouth daily.   Vitamin D3 50 MCG (2000 UT) Tabs Take 6,000 Units by mouth daily.        Signed: Blanchie Dessert Adriell Polansky 01/28/2019, 11:45 AM

## 2019-01-28 NOTE — Plan of Care (Signed)
Pt doing well. Pt given D/C instructions with verbal understanding. Rx's were sent to pharmacy by MD. Pt's incision dressing was changed per MD order. Pt's IV was removed prior to D/C. Pt D/C'd home via wheelchair per MD order. Pt is stable @ D/C and has no other needs at this time. Holli Humbles, RN

## 2019-01-28 NOTE — Progress Notes (Signed)
Physical Therapy Treatment and Discharge Patient Details Name: Matthew Kirk. MRN: [id number] DOB: [birth year] Today's Date: 01/28/2019    History of Present Illness Pt is 68 yo male s/p revision of L4-5 and decompression of L5-S1. PMH including prior lumbar laminectomy/decompression,  HTN, and HOH.    PT Comments    Pt progressing well with post-op mobility. He was able to demonstrate transfers and ambulation with gross modified independence to supervision for safety. Pt was educated on precautions, brace application/wearing schedule, appropriate activity progression, and car transfer. Pt anticipates d/c home today. Will sign off as pt moving well and progress towards goals are adequate for discharge. If needs change, please reconsult.   Follow Up Recommendations  No PT follow up;Supervision for mobility/OOB     Equipment Recommendations  None recommended by PT    Recommendations for Other Services       Precautions / Restrictions Precautions Precautions: Back Precaution Booklet Issued: Yes (comment) Precaution Comments: recalled 3/3 precautions, reviewed all back precautions Required Braces or Orthoses: Spinal Brace Spinal Brace: Lumbar corset;Applied in sitting position Restrictions Weight Bearing Restrictions: No    Mobility  Bed Mobility Overal bed mobility: Needs Assistance Bed Mobility: Rolling;Sidelying to Sit Rolling: Supervision Sidelying to sit: Supervision       General bed mobility comments: pt continued to use railing for support and elevated HOB despite cues to attempt without as he will not have at home.  Transfers Overall transfer level: Needs assistance Equipment used: None Transfers: Sit to/from Stand Sit to Stand: Supervision; modified independence         General transfer comment: Able to power up to full stand without assistance. Continues to be guarded due to pain however was able to complete transfer in less time compared to  yesterday.   Ambulation/Gait Ambulation/Gait assistance: Supervision Gait Distance (Feet): 400 Feet Assistive device: None Gait Pattern/deviations: Step-through pattern;Decreased stride length;Trunk flexed Gait velocity: Decreased Gait velocity interpretation: <1.8 ft/sec, indicate of risk for recurrent falls General Gait Details: Pt ambulating well. Noted mildly flexed knees during gait cycle - pt reporting pain in low back with fully extended knees.    Stairs         General stair comments: Pt declined stair training, stating he feels comfortable entering his home.    Wheelchair Mobility    Modified Rankin (Stroke Patients Only)       Balance Overall balance assessment: Mild deficits observed, not formally tested                                          Cognition Arousal/Alertness: Awake/alert Behavior During Therapy: WFL for tasks assessed/performed Overall Cognitive Status: Within Functional Limits for tasks assessed                                        Exercises      General Comments        Pertinent Vitals/Pain Pain Assessment: Faces Faces Pain Scale: Hurts little more Pain Location: back Pain Descriptors / Indicators: Aching;Discomfort;Grimacing;Sore Pain Intervention(s): Limited activity within patient's tolerance;Monitored during session;Repositioned    Home Living                      Prior Function  PT Goals (current goals can now be found in the care plan section) Acute Rehab PT Goals Patient Stated Goal: go home PT Goal Formulation: With patient Time For Goal Achievement: 02/03/19 Potential to Achieve Goals: Good Progress towards PT goals: Progressing toward goals    Frequency    Min 5X/week      PT Plan Current plan remains appropriate    Co-evaluation              AM-PAC PT "6 Clicks" Mobility   Outcome Measure  Help needed turning from your back to your side  while in a flat bed without using bedrails?: None Help needed moving from lying on your back to sitting on the side of a flat bed without using bedrails?: None Help needed moving to and from a bed to a chair (including a wheelchair)?: None Help needed standing up from a chair using your arms (e.g., wheelchair or bedside chair)?: None Help needed to walk in hospital room?: A Little Help needed climbing 3-5 steps with a railing? : A Little 6 Click Score: 22    End of Session Equipment Utilized During Treatment: Gait belt;Back brace Activity Tolerance: Patient tolerated treatment well Patient left: in bed;with call bell/phone within reach Nurse Communication: Mobility status PT Visit Diagnosis: Unsteadiness on feet (R26.81);Pain Pain - part of body: (back)     Time: 4818-5631 PT Time Calculation (min) (ACUTE ONLY): 13 min  Charges:  $Gait Training: 8-22 mins                     Rolinda Roan, PT, DPT Acute Rehabilitation Services Pager: [phone] Office: [phone]    Thelma Comp 01/28/2019, 12:24 PM

## 2019-01-28 NOTE — Discharge Instructions (Signed)

## 2019-01-29 MED FILL — Heparin Sodium (Porcine) Inj 1000 Unit/ML: INTRAMUSCULAR | Qty: 30 | Status: AC

## 2019-01-29 MED FILL — Sodium Chloride IV Soln 0.9%: INTRAVENOUS | Qty: 1000 | Status: AC

## 2019-02-02 ENCOUNTER — Encounter (HOSPITAL_COMMUNITY): Payer: Self-pay | Admitting: Neurological Surgery

## 2019-02-05 ENCOUNTER — Encounter: Payer: Self-pay | Admitting: Gastroenterology

## 2019-04-16 ENCOUNTER — Ambulatory Visit: Payer: Medicare HMO | Attending: Internal Medicine

## 2019-04-16 DIAGNOSIS — Z23 Encounter for immunization: Secondary | ICD-10-CM

## 2019-04-16 NOTE — Progress Notes (Signed)
   [id number]  Name:  Matthew Kirk.    MRN: [id number] DOB: 05-26-1950  04/16/2019  [name] was observed post Covid-19 immunization for 15 minutes without incidence. He was provided with Vaccine Information Sheet and instruction to access the V-Safe system.   [name] was instructed to call 911 with any severe reactions post vaccine: Marland Kitchen Difficulty breathing  . Swelling of your face and throat  . A fast heartbeat  . A bad rash all over your body  . Dizziness and weakness    Immunizations Administered    Name Date Dose VIS Date Route   Pfizer COVID-19 Vaccine 04/16/2019  9:40 AM 0.3 mL 03/05/2019 Intramuscular   Manufacturer: [employer], [employer]   Lot: [id number]   NDC: [address]-3

## 2019-05-06 ENCOUNTER — Ambulatory Visit: Payer: Medicare HMO | Attending: Internal Medicine

## 2019-05-06 DIAGNOSIS — Z23 Encounter for immunization: Secondary | ICD-10-CM

## 2019-05-06 NOTE — Progress Notes (Signed)
   [id number]  Name:  Matthew Kirk.    MRN: [id number] DOB: Jul 23, 1950  05/06/2019  Matthew Kirk was observed post Covid-19 immunization for 15 minutes without incidence. He was provided with Vaccine Information Sheet and instruction to access the V-Safe system.   Matthew Kirk was instructed to call 911 with any severe reactions post vaccine: Marland Kitchen Difficulty breathing  . Swelling of your face and throat  . A fast heartbeat  . A bad rash all over your body  . Dizziness and weakness    Immunizations Administered    Name Date Dose VIS Date Route   Pfizer COVID-19 Vaccine 05/06/2019  2:41 PM 0.3 mL 03/05/2019 Intramuscular   Manufacturer: [employer], [employer]   Lot: [id number]   NDC: [address]-7

## 2019-09-16 ENCOUNTER — Other Ambulatory Visit (HOSPITAL_COMMUNITY): Payer: Self-pay | Admitting: Neurological Surgery

## 2019-09-16 ENCOUNTER — Other Ambulatory Visit: Payer: Self-pay | Admitting: Neurological Surgery

## 2019-09-16 DIAGNOSIS — M5412 Radiculopathy, cervical region: Secondary | ICD-10-CM

## 2019-09-22 ENCOUNTER — Ambulatory Visit (HOSPITAL_COMMUNITY): Payer: Medicare HMO

## 2019-09-22 ENCOUNTER — Encounter (HOSPITAL_COMMUNITY): Payer: Self-pay

## 2019-10-11 ENCOUNTER — Other Ambulatory Visit: Payer: Self-pay | Admitting: Neurological Surgery

## 2019-10-11 DIAGNOSIS — M5412 Radiculopathy, cervical region: Secondary | ICD-10-CM

## 2019-11-03 ENCOUNTER — Other Ambulatory Visit: Payer: Self-pay

## 2019-11-03 ENCOUNTER — Ambulatory Visit
Admission: RE | Admit: 2019-11-03 | Discharge: 2019-11-03 | Disposition: A | Discharge status: Home / Self Care | Payer: Medicare HMO | Source: Ambulatory Visit | Attending: Neurological Surgery | Admitting: Neurological Surgery

## 2019-11-03 DIAGNOSIS — M5412 Radiculopathy, cervical region: Secondary | ICD-10-CM

## 2020-12-18 IMAGING — MR MR CERVICAL SPINE W/O CM
5 series · 29 of 48 positions shown · non-contrast
Comparison: Radiographs 08/10/2019.

CLINICAL DATA: Intermittent neck pain for 6 months. No acute injury
or prior relevant surgery. C7 cervical radiculopathy, laterality not
specified.

EXAM:
MRI CERVICAL SPINE WITHOUT CONTRAST
TECHNIQUE: Multiplanar, multisequence MR imaging of the cervical spine was
performed. No intravenous contrast was administered.

[Series 5: T2 · sagittal · 3.0mm · 0.55mm/px · 7 of 15 slices shown (1 of 2)]
[im 1/15]
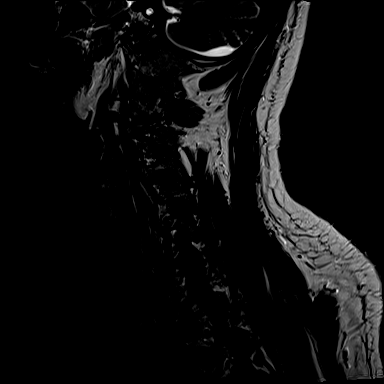
[im 3/15]
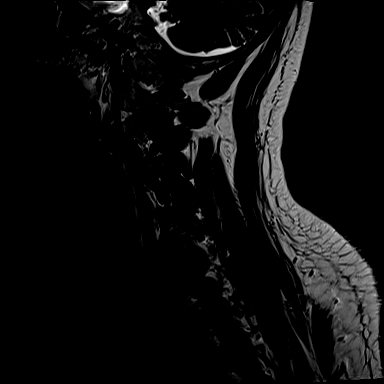
[im 5/15]
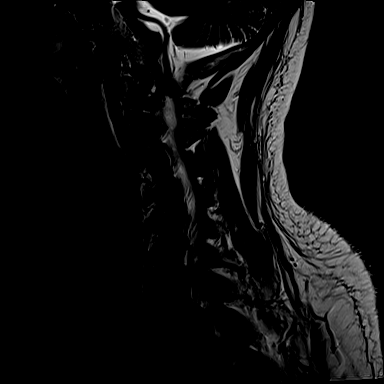
[im 8/15]
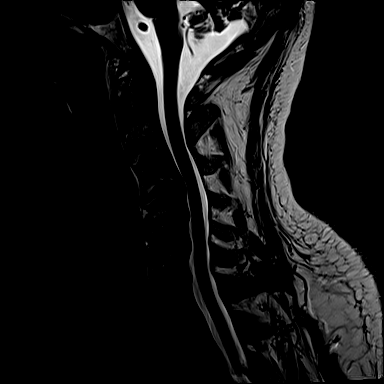
[im 10/15]
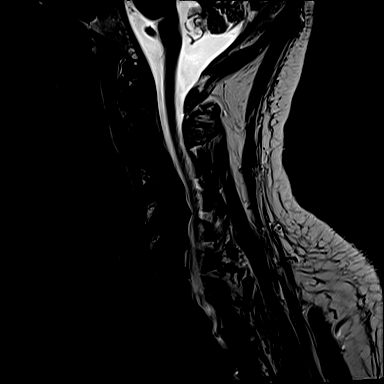
[im 12/15]
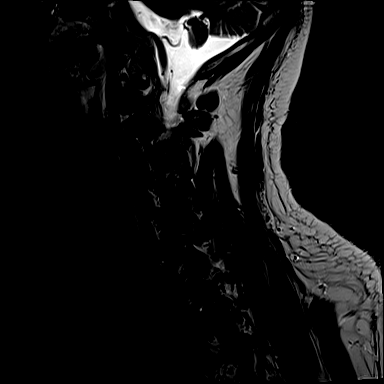
[im 15/15]
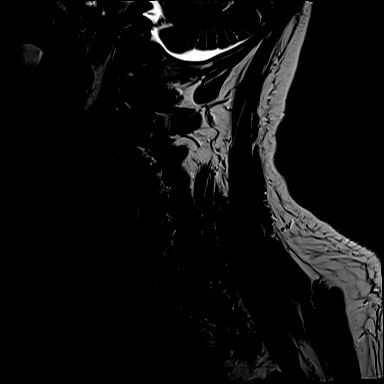

[Series 6: T1 · sagittal · 3.0mm · 0.82mm/px · 7 of 15 slices shown]
[im 1/15]
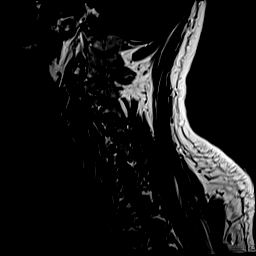
[im 3/15]
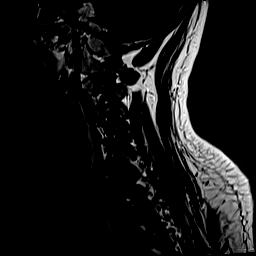
[im 5/15]
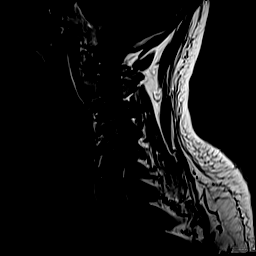
[im 8/15]
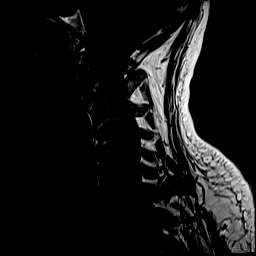
[im 10/15]
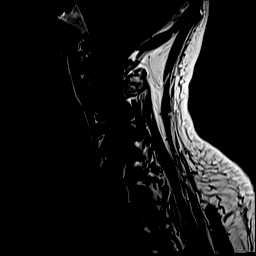
[im 12/15]
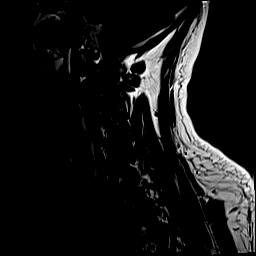
[im 15/15]
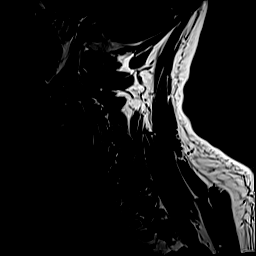

[Series 7: STIR · sagittal · 3.0mm · 0.33mm/px · 6 of 15 slices shown]
[im 1/15]
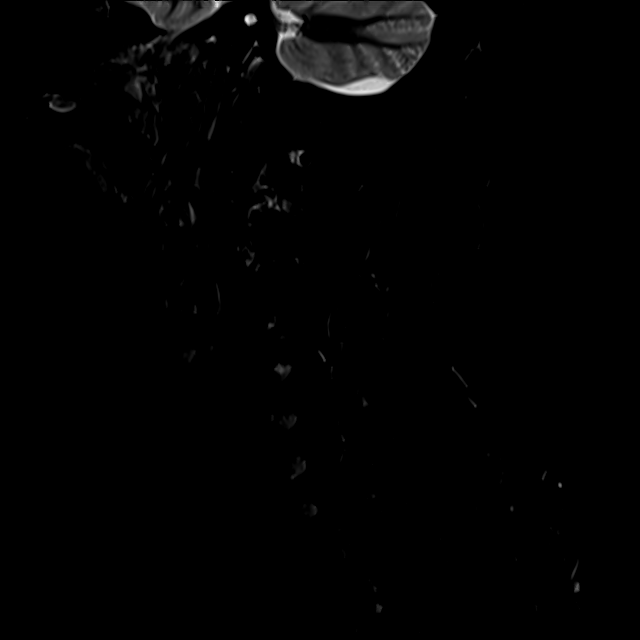
[im 3/15]
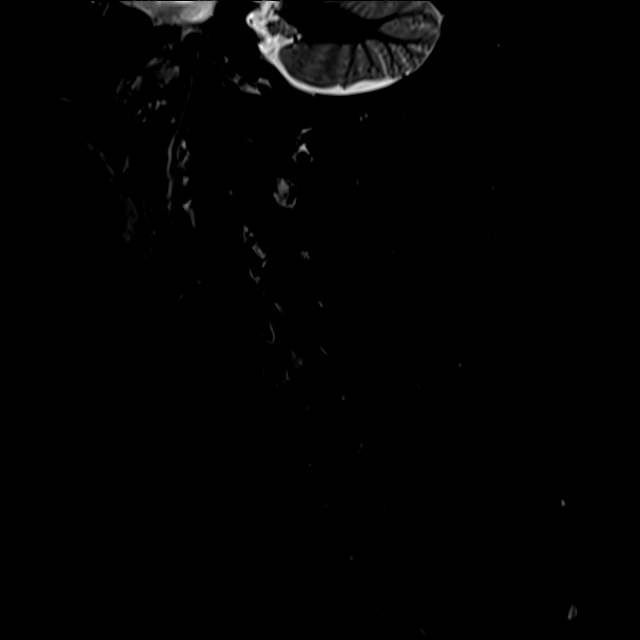
[im 6/15]
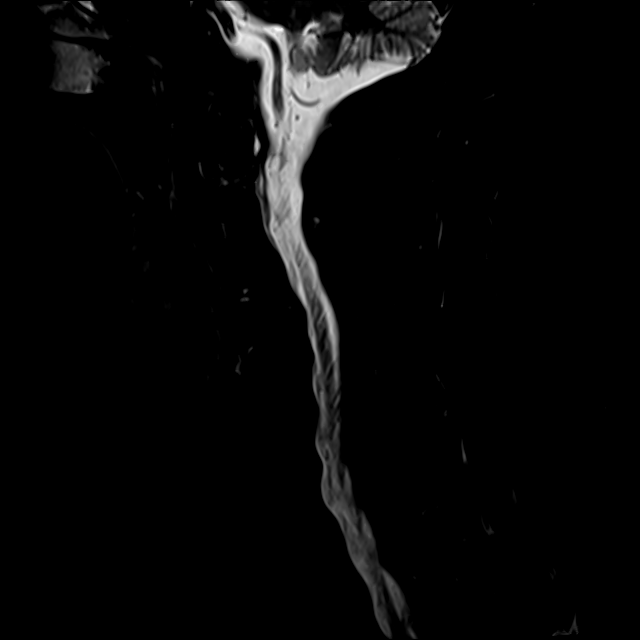
[im 9/15]
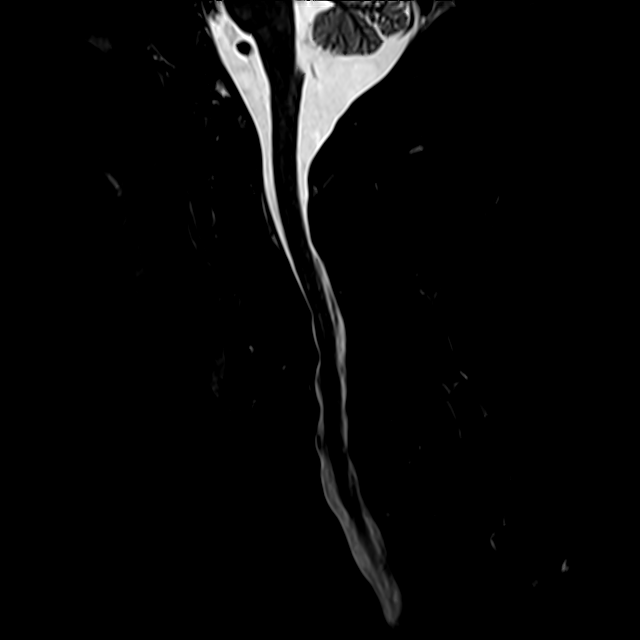
[im 12/15]
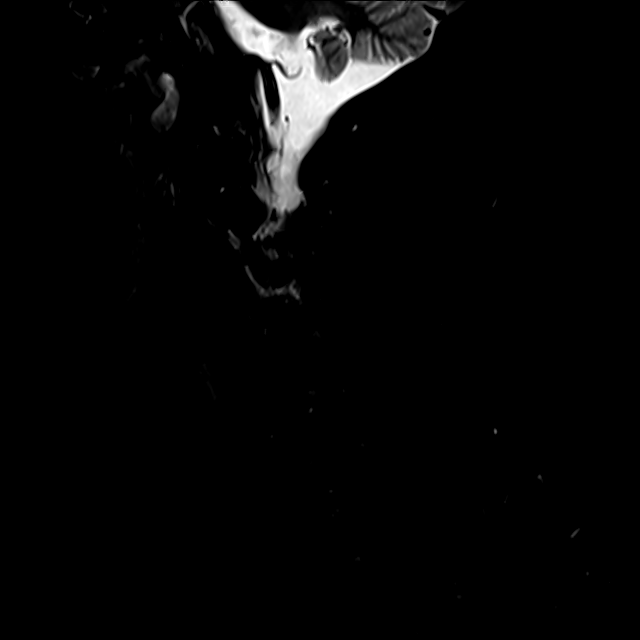
[im 15/15]
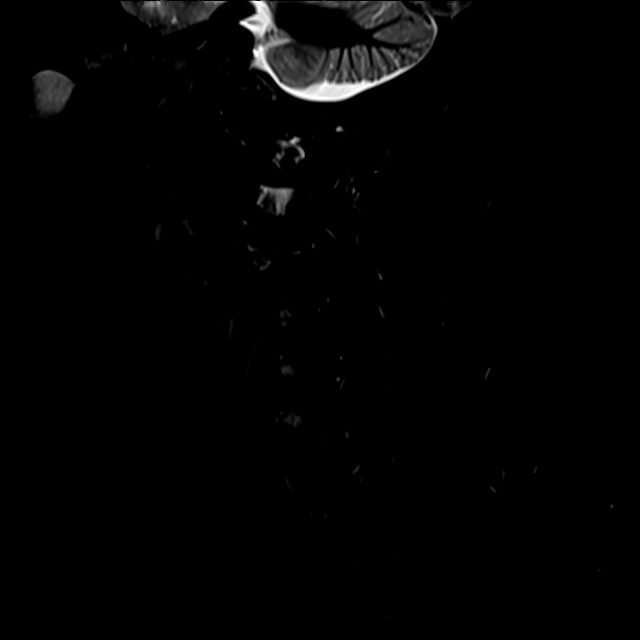

[Series 8: T2 · axial · 3.0mm · 0.50mm/px · z∈[-67,+41]mm · 8 of 34 slices shown (2 of 2)]
[im 1/34]
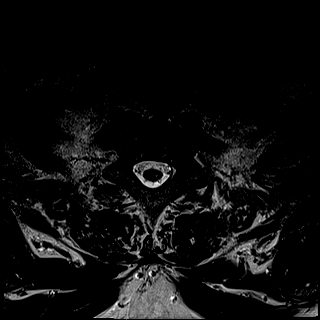
[im 6/34]
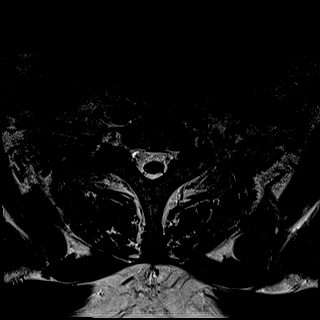
[im 11/34]
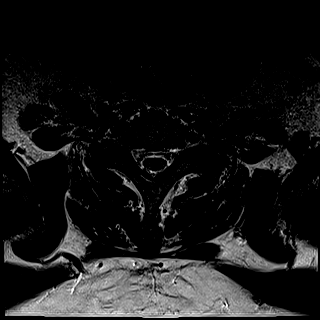
[im 16/34]
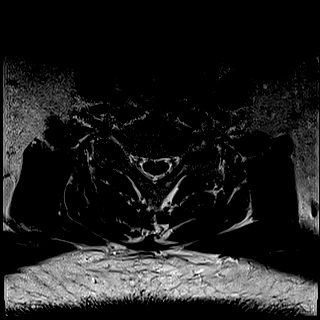
[im 18/34]
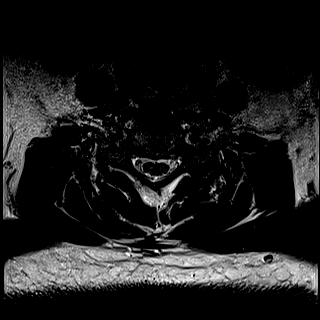
[im 23/34]
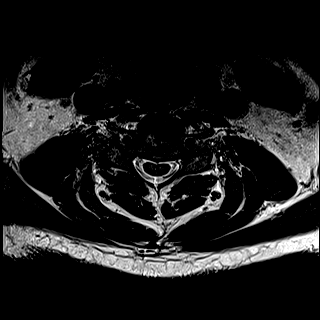
[im 28/34]
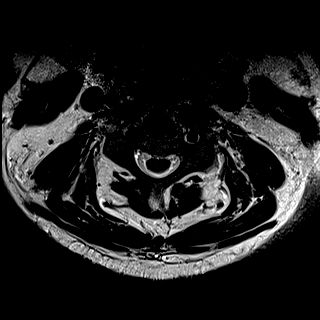
[im 34/34]
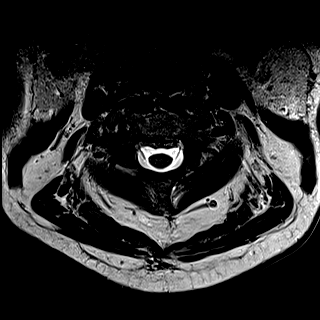

[Series 9: GRE · axial · 3.0mm · 0.42mm/px · 1 of 34 slices shown]
[im 1/34]
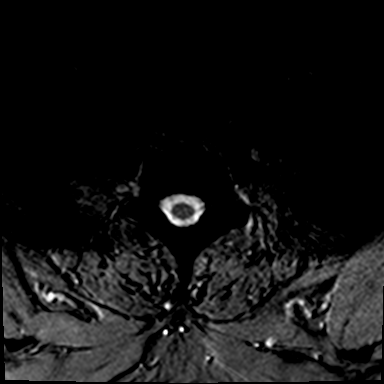

[29 of 48 positions shown; findings below may reference images not displayed]

FINDINGS: Alignment: Reversal of the usual cervical lordosis, similar to
previous radiographs. No focal angulation or significant listhesis.

Vertebrae: No acute or suspicious osseous findings.

Cord: Normal in signal and caliber.

Posterior Fossa, vertebral arteries, paraspinal tissues: Visualized
portions of the posterior fossa appear unremarkable. No significant
paraspinal findings. Bilateral vertebral artery flow voids.

Disc levels:

C2-3: The disc appears normal. Asymmetric facet hypertrophy on the
left. Minimal foraminal narrowing bilaterally. The spinal canal is
widely patent.

C3-4: Spondylosis with posterior osteophytes covering diffusely
bulging disc material. Asymmetric right-sided facet hypertrophy.
Mild right-greater-than-left foraminal narrowing. No cord deformity.

C4-5: Spondylosis with posterior osteophytes covering diffusely
bulging disc material. Moderate foraminal narrowing bilaterally,
worse on the left. No cord deformity.

C5-6: Spondylosis with posterior osteophytes covering diffusely
bulging disc material. Moderate foraminal narrowing bilaterally,
worse on the right. No cord deformity.

C6-7: Asymmetric moderate to severe right foraminal narrowing due to
a disc osteophyte complex, best seen on the axial images. Right C7
nerve root encroachment is likely. The left foramen appears patent.
No cord deformity.

C7-T1: Mild facet hypertrophy. No spinal stenosis or nerve root
encroachment.
IMPRESSION: 1. Multilevel spondylosis as described. There is moderate to severe
right foraminal narrowing at C6-7 due to a disc osteophyte complex
with probable right C7 nerve root encroachment.
2. Moderate foraminal narrowing bilaterally at C4-5 and C5-6 due to
spondylosis.
3. No cord deformity or abnormal cord signal.

## 2024-03-04 ENCOUNTER — Other Ambulatory Visit: Payer: Self-pay | Admitting: Neurological Surgery

## 2024-03-04 DIAGNOSIS — M5412 Radiculopathy, cervical region: Secondary | ICD-10-CM

## 2024-03-04 DIAGNOSIS — M5416 Radiculopathy, lumbar region: Secondary | ICD-10-CM

## 2024-03-05 ENCOUNTER — Encounter: Payer: Self-pay | Admitting: Neurological Surgery

## 2024-03-29 ENCOUNTER — Ambulatory Visit
Admission: RE | Admit: 2024-03-29 | Discharge: 2024-03-29 | Disposition: A | Discharge status: Home / Self Care | Source: Ambulatory Visit | Attending: Neurological Surgery | Admitting: Neurological Surgery

## 2024-03-29 DIAGNOSIS — M5412 Radiculopathy, cervical region: Secondary | ICD-10-CM

## 2024-03-29 DIAGNOSIS — M5416 Radiculopathy, lumbar region: Secondary | ICD-10-CM

## 2024-03-29 MED ORDER — GADOPICLENOL 0.5 MMOL/ML IV SOLN
10.0000 mL | Freq: Once | INTRAVENOUS | Status: AC | PRN
  Administered 2024-03-29: 10 mL via INTRAVENOUS
